# Patient Record
Sex: Male | Born: 1965 | Race: Black or African American | Hispanic: No | Marital: Single | Smoking: Former smoker
Health system: Southern US, Community
[De-identification: ages and names within clinical notes are randomized; demographics above are authoritative.]

## PROBLEM LIST (undated history)

## (undated) DIAGNOSIS — C3490 Malignant neoplasm of unspecified part of unspecified bronchus or lung: Secondary | ICD-10-CM

## (undated) HISTORY — PX: APPENDECTOMY: SHX54

---

## 2005-04-09 ENCOUNTER — Emergency Department (HOSPITAL_COMMUNITY): Admission: EM | Admit: 2005-04-09 | Discharge: 2005-04-09 | Payer: Self-pay | Admitting: Emergency Medicine

## 2011-11-10 ENCOUNTER — Emergency Department (HOSPITAL_COMMUNITY)
Admission: EM | Admit: 2011-11-10 | Discharge: 2011-11-10 | Disposition: A | Discharge status: Home / Self Care | Payer: Self-pay | Attending: Emergency Medicine | Admitting: Emergency Medicine

## 2011-11-10 ENCOUNTER — Emergency Department (HOSPITAL_COMMUNITY): Payer: Self-pay

## 2011-11-10 DIAGNOSIS — W19XXXA Unspecified fall, initial encounter: Secondary | ICD-10-CM | POA: Insufficient documentation

## 2011-11-10 DIAGNOSIS — S060X9A Concussion with loss of consciousness of unspecified duration, initial encounter: Secondary | ICD-10-CM | POA: Insufficient documentation

## 2011-11-10 DIAGNOSIS — R569 Unspecified convulsions: Secondary | ICD-10-CM | POA: Insufficient documentation

## 2011-11-10 DIAGNOSIS — F10929 Alcohol use, unspecified with intoxication, unspecified: Secondary | ICD-10-CM

## 2011-11-10 DIAGNOSIS — R51 Headache: Secondary | ICD-10-CM | POA: Insufficient documentation

## 2011-11-10 DIAGNOSIS — F1010 Alcohol abuse, uncomplicated: Secondary | ICD-10-CM | POA: Insufficient documentation

## 2011-11-10 DIAGNOSIS — S0990XA Unspecified injury of head, initial encounter: Secondary | ICD-10-CM

## 2011-11-10 LAB — CBC WITH DIFFERENTIAL/PLATELET
Eosinophils Relative: 1 % (ref 0–5)
HCT: 44.5 % (ref 39.0–52.0)
Lymphocytes Relative: 33 % (ref 12–46)
Lymphs Abs: 3.6 10*3/uL (ref 0.7–4.0)
MCV: 94.7 fL (ref 78.0–100.0)
Monocytes Absolute: 0.8 10*3/uL (ref 0.1–1.0)
RBC: 4.7 MIL/uL (ref 4.22–5.81)
WBC: 10.8 10*3/uL — ABNORMAL HIGH (ref 4.0–10.5)

## 2011-11-10 LAB — COMPREHENSIVE METABOLIC PANEL
ALT: 26 U/L (ref 0–53)
CO2: 22 mEq/L (ref 19–32)
Calcium: 9.4 mg/dL (ref 8.4–10.5)
Creatinine, Ser: 0.84 mg/dL (ref 0.50–1.35)
GFR calc Af Amer: >90 mL/min (ref >90)
GFR calc non Af Amer: >90 mL/min (ref >90)
Glucose, Bld: 96 mg/dL (ref 70–99)

## 2011-11-10 LAB — RAPID URINE DRUG SCREEN, HOSP PERFORMED
Barbiturates: NOT DETECTED
Cocaine: POSITIVE — AB
Opiates: NOT DETECTED

## 2011-11-10 LAB — URINALYSIS, ROUTINE W REFLEX MICROSCOPIC
Bilirubin Urine: NEGATIVE
Nitrite: NEGATIVE
Specific Gravity, Urine: <1.005 — ABNORMAL LOW (ref 1.005–1.030)
pH: 5.5 (ref 5.0–8.0)

## 2011-11-10 MED ORDER — SODIUM CHLORIDE 0.9 % IV BOLUS (SEPSIS)
1000.0000 mL | Freq: Once | INTRAVENOUS | Status: AC
  Administered 2011-11-10: 1000 mL via INTRAVENOUS
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
## 2011-11-10 NOTE — ED Notes (Signed)
Pt called friend to drive him home. Waiting for ride to arrive. Dr. Bernette Mayers arrive. nad noted. Pt alert and oriented. Sitting up in bed

## 2011-11-10 NOTE — ED Notes (Signed)
Pt resting in bed with nad noted. NSR on cardiac monitor. Alert at this time

## 2011-11-10 NOTE — ED Notes (Signed)
Sister here to transport pt home.
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
## 2013-03-26 ENCOUNTER — Emergency Department (HOSPITAL_COMMUNITY): Payer: Self-pay

## 2013-03-26 ENCOUNTER — Encounter (HOSPITAL_COMMUNITY): Payer: Self-pay | Admitting: Emergency Medicine

## 2013-03-26 ENCOUNTER — Emergency Department (HOSPITAL_COMMUNITY)
Admission: EM | Admit: 2013-03-26 | Discharge: 2013-03-27 | Disposition: A | Discharge status: Home / Self Care | Payer: Self-pay | Attending: Emergency Medicine | Admitting: Emergency Medicine

## 2013-03-26 DIAGNOSIS — S21212A Laceration without foreign body of left back wall of thorax without penetration into thoracic cavity, initial encounter: Secondary | ICD-10-CM

## 2013-03-26 DIAGNOSIS — F17200 Nicotine dependence, unspecified, uncomplicated: Secondary | ICD-10-CM | POA: Insufficient documentation

## 2013-03-26 DIAGNOSIS — Z23 Encounter for immunization: Secondary | ICD-10-CM | POA: Insufficient documentation

## 2013-03-26 DIAGNOSIS — S21209A Unspecified open wound of unspecified back wall of thorax without penetration into thoracic cavity, initial encounter: Secondary | ICD-10-CM | POA: Insufficient documentation

## 2013-03-26 DIAGNOSIS — S0003XA Contusion of scalp, initial encounter: Secondary | ICD-10-CM | POA: Insufficient documentation

## 2013-03-26 MED ORDER — TETANUS-DIPHTH-ACELL PERTUSSIS 5-2.5-18.5 LF-MCG/0.5 IM SUSP
0.5000 mL | Freq: Once | INTRAMUSCULAR | Status: AC
  Administered 2013-03-26: 0.5 mL via INTRAMUSCULAR
  Filled 2013-03-26: qty 0.5
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
## 2013-03-27 MED ORDER — IBUPROFEN 800 MG PO TABS: 800.0000 mg | ORAL_TABLET | Freq: Three times a day (TID) | ORAL | Status: DC

## 2013-03-27 MED ORDER — LIDOCAINE-EPINEPHRINE (PF) 1 %-1:200000 IJ SOLN
INTRAMUSCULAR | Status: AC
  Filled 2013-03-27: qty 10

## 2013-03-27 NOTE — ED Notes (Signed)
Discharge instructions given and reviewed with patient.  Prescription given for Ibuprofen; effects and use explained.  Patient verbalized understanding to take medication as directed and to keep wound clean and dry and return in 7 days for staple removal.  Patient ambulatory with steady gait; discharged home in good condition.

## 2013-03-30 NOTE — ED Provider Notes (Signed)
Medical screening examination/treatment/procedure(s) were performed by non-physician practitioner and as supervising physician I was immediately available for consultation/collaboration.  EKG Interpretation   None       Burk Hoctor, MD, FACEP   Brittony Billick L Jailani Hogans, MD 03/30/13 1559 

## 2013-04-05 ENCOUNTER — Encounter (HOSPITAL_COMMUNITY): Payer: Self-pay | Admitting: Emergency Medicine

## 2013-04-05 ENCOUNTER — Emergency Department (HOSPITAL_COMMUNITY)
Admission: EM | Admit: 2013-04-05 | Discharge: 2013-04-05 | Disposition: A | Discharge status: Home / Self Care | Payer: Self-pay | Attending: Emergency Medicine | Admitting: Emergency Medicine

## 2013-04-05 DIAGNOSIS — F17200 Nicotine dependence, unspecified, uncomplicated: Secondary | ICD-10-CM | POA: Insufficient documentation

## 2013-04-05 DIAGNOSIS — Z4802 Encounter for removal of sutures: Secondary | ICD-10-CM | POA: Insufficient documentation

## 2013-04-05 DIAGNOSIS — Z791 Long term (current) use of non-steroidal anti-inflammatories (NSAID): Secondary | ICD-10-CM | POA: Insufficient documentation
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
## 2013-04-05 NOTE — ED Notes (Signed)
Staples removed by Dr Read Drivers and dry dressing applied

## 2013-04-05 NOTE — ED Notes (Addendum)
Here for staple removal left back placed on 03/26/13.  Site clean and dry.    C/o pain in left arm

## 2014-06-14 ENCOUNTER — Encounter (HOSPITAL_COMMUNITY): Payer: Self-pay

## 2014-06-14 ENCOUNTER — Emergency Department (HOSPITAL_COMMUNITY): Payer: Self-pay

## 2014-06-14 ENCOUNTER — Emergency Department (HOSPITAL_COMMUNITY)
Admission: EM | Admit: 2014-06-14 | Discharge: 2014-06-14 | Disposition: A | Discharge status: Home / Self Care | Payer: Self-pay | Attending: Emergency Medicine | Admitting: Emergency Medicine

## 2014-06-14 DIAGNOSIS — M25511 Pain in right shoulder: Secondary | ICD-10-CM | POA: Insufficient documentation

## 2014-06-14 DIAGNOSIS — I10 Essential (primary) hypertension: Secondary | ICD-10-CM | POA: Insufficient documentation

## 2014-06-14 DIAGNOSIS — Z720 Tobacco use: Secondary | ICD-10-CM | POA: Insufficient documentation

## 2014-06-14 DIAGNOSIS — Z791 Long term (current) use of non-steroidal anti-inflammatories (NSAID): Secondary | ICD-10-CM | POA: Insufficient documentation

## 2014-06-14 MED ORDER — DEXAMETHASONE 4 MG PO TABS: 4.0000 mg | ORAL_TABLET | Freq: Two times a day (BID) | ORAL | Status: DC

## 2014-06-14 MED ORDER — HYDROCODONE-ACETAMINOPHEN 5-325 MG PO TABS
1.0000 | ORAL_TABLET | ORAL | Status: DC | PRN
Start: 2014-06-14 — End: 2014-08-11

## 2014-06-14 MED ORDER — IBUPROFEN 800 MG PO TABS
800.0000 mg | ORAL_TABLET | Freq: Once | ORAL | Status: AC
  Administered 2014-06-14: 800 mg via ORAL
  Filled 2014-06-14: qty 1

## 2014-06-14 MED ORDER — HYDROCHLOROTHIAZIDE 12.5 MG PO CAPS: 12.5000 mg | ORAL_CAPSULE | Freq: Every day | ORAL | Status: DC

## 2014-06-14 MED ORDER — HYDROCHLOROTHIAZIDE 12.5 MG PO CAPS
12.5000 mg | ORAL_CAPSULE | Freq: Every day | ORAL | Status: DC
  Administered 2014-06-14: 12.5 mg via ORAL
  Filled 2014-06-14 (×2): qty 1

## 2014-06-14 MED ORDER — IBUPROFEN 800 MG PO TABS: 800.0000 mg | ORAL_TABLET | Freq: Three times a day (TID) | ORAL | Status: DC

## 2014-06-14 NOTE — Discharge Instructions (Signed)
Shoulder Pain The shoulder is the joint that connects your arms to your body. The bones that form the shoulder joint include the upper arm bone (humerus), the shoulder blade (scapula), and the collarbone (clavicle). The top of the humerus is shaped like a ball and fits into a rather flat socket on the scapula (glenoid cavity). A combination of muscles and strong, fibrous tissues that connect muscles to bones (tendons) support your shoulder joint and hold the ball in the socket. Small, fluid-filled sacs (bursae) are located in different areas of the joint. They act as cushions between the bones and the overlying soft tissues and help reduce friction between the gliding tendons and the bone as you move your arm. Your shoulder joint allows a wide range of motion in your arm. This range of motion allows you to do things like scratch your back or throw a ball. However, this range of motion also makes your shoulder more prone to pain from overuse and injury. Causes of shoulder pain can originate from both injury and overuse and usually can be grouped in the following four categories:  Redness, swelling, and pain (inflammation) of the tendon (tendinitis) or the bursae (bursitis).  Instability, such as a dislocation of the joint.  Inflammation of the joint (arthritis).  Broken bone (fracture). HOME CARE INSTRUCTIONS   Apply ice to the sore area.  Put ice in a plastic bag.  Place a towel between your skin and the bag.  Leave the ice on for 15-20 minutes, 3-4 times per day for the first 2 days, or as directed by your health care provider.  Stop using cold packs if they do not help with the pain.  If you have a shoulder sling or immobilizer, wear it as long as your caregiver instructs. Only remove it to shower or bathe. Move your arm as little as possible, but keep your hand moving to prevent swelling.  Squeeze a soft ball or foam pad as much as possible to help prevent swelling.  Only take  over-the-counter or prescription medicines for pain, discomfort, or fever as directed by your caregiver. SEEK MEDICAL CARE IF:   Your shoulder pain increases, or new pain develops in your arm, hand, or fingers.  Your hand or fingers become cold and numb.  Your pain is not relieved with medicines. SEEK IMMEDIATE MEDICAL CARE IF:   Your arm, hand, or fingers are numb or tingling.  Your arm, hand, or fingers are significantly swollen or turn white or blue. MAKE SURE YOU:   Understand these instructions.  Will watch your condition.  Will get help right away if you are not doing well or get worse. Document Released: 01/10/2005 Document Revised: 08/17/2013 Document Reviewed: 03/17/2011 [facility] Patient Information 2015 [place], [place]. This information is not intended to replace advice given to you by your health care provider. Make sure you discuss any questions you have with your health care provider.  Hypertension Hypertension is another name for high blood pressure. High blood pressure forces your heart to work harder to pump blood. A blood pressure reading has two numbers, which includes a higher number over a lower number (example: 110/72). HOME CARE   Have your blood pressure rechecked by your doctor.  Only take medicine as told by your doctor. Follow the directions carefully. The medicine does not work as well if you skip doses. Skipping doses also puts you at risk for problems.  Do not smoke.  Monitor your blood pressure at home as told by your  doctor. GET HELP IF:  You think you are having a reaction to the medicine you are taking.  You have repeat headaches or feel dizzy.  You have puffiness (swelling) in your ankles.  You have trouble with your vision. GET HELP RIGHT AWAY IF:   You get a very bad headache and are confused.  You feel weak, numb, or faint.  You get chest or belly (abdominal) pain.  You throw up (vomit).  You cannot breathe very well. MAKE  SURE YOU:   Understand these instructions.  Will watch your condition.  Will get help right away if you are not doing well or get worse. Document Released: 09/19/2007 Document Revised: 04/07/2013 Document Reviewed: 01/23/2013 [facility] Patient Information 2015 [place], [place]. This information is not intended to replace advice given to you by your health care provider. Make sure you discuss any questions you have with your health care provider.

## 2014-06-14 NOTE — ED Provider Notes (Signed)
CSN: [id number]     Arrival date & time 06/14/14  0932 History   First MD Initiated Contact with Patient 06/14/14 [phone]     Chief Complaint  Patient presents with  . Shoulder Pain     (Consider location/radiation/quality/duration/timing/severity/associated sxs/prior Treatment) Patient is a 49 y.o. male presenting with shoulder pain. The history is provided by the patient.  Shoulder Pain Location:  Shoulder Time since incident:  2 weeks Injury: no   Shoulder location:  R shoulder Pain details:    Quality:  Aching and sharp   Radiates to:  R shoulder   Severity:  Moderate   Onset quality:  Gradual   Duration:  2 weeks   Timing:  Intermittent   Progression:  Worsening Chronicity:  Recurrent Handedness:  Right-handed Relieved by:  Nothing Worsened by:  Nothing tried Ineffective treatments:  None tried Associated symptoms: decreased range of motion and stiffness   Associated symptoms: no back pain and no neck pain   Risk factors: frequent fractures     History reviewed. No pertinent past medical history. History reviewed. No pertinent past surgical history. No family history on file. History  Substance Use Topics  . Smoking status: Current Every Day Smoker -- 0.50 packs/day    Types: Cigarettes  . Smokeless tobacco: Not on file  . Alcohol Use: 6.0 oz/week    10 Cans of beer per week     Comment: occ    Review of Systems  Constitutional: Negative for activity change.       All ROS Neg except as noted in HPI  HENT: Negative.   Eyes: Negative for photophobia and discharge.  Respiratory: Negative for cough, shortness of breath and wheezing.   Cardiovascular: Negative for chest pain and palpitations.  Gastrointestinal: Negative for abdominal pain and blood in stool.  Genitourinary: Negative for dysuria, frequency and hematuria.  Musculoskeletal: Positive for arthralgias and stiffness. Negative for back pain and neck pain.  Skin: Negative.   Neurological: Negative for  dizziness, seizures and speech difficulty.  Psychiatric/Behavioral: Negative for hallucinations and confusion.      Allergies  Review of patient's allergies indicates no known allergies.  Home Medications   Prior to Admission medications   Medication Sig Start Date End Date Taking? Authorizing Provider  ibuprofen (ADVIL,MOTRIN) 800 MG tablet Take 1 tablet (800 mg total) by mouth 3 (three) times daily. 03/27/13   Idalia Needle. Sanford, PA-C   BP 165/118 mmHg  Pulse 82  Temp(Src) 98.1 F (36.7 C) (Oral)  Resp 20  Ht 5\' 9"  (1.753 m)  Wt 150 lb (68.04 kg)  BMI 22.14 kg/m2  SpO2 99% Physical Exam  Constitutional: He is oriented to person, place, and time. He appears well-developed and well-nourished.  Non-toxic appearance.  HENT:  Head: Normocephalic.  Right Ear: Tympanic membrane and external ear normal.  Left Ear: Tympanic membrane and external ear normal.  Eyes: EOM and lids are normal. Pupils are equal, round, and reactive to light.  Neck: Normal range of motion. Neck supple. Carotid bruit is not present.  Cardiovascular: Normal rate, regular rhythm, normal heart sounds, intact distal pulses and normal pulses.   Pulmonary/Chest: Breath sounds normal. No respiratory distress.  Abdominal: Soft. Bowel sounds are normal. There is no tenderness. There is no guarding.  Musculoskeletal:       Right shoulder: He exhibits decreased range of motion and crepitus. He exhibits no swelling and no deformity.  No dislocation. No hot joints noted.  Lymphadenopathy:  Head (right side): No submandibular adenopathy present.       Head (left side): No submandibular adenopathy present.    He has no cervical adenopathy.  Neurological: He is alert and oriented to person, place, and time. He has normal strength. No cranial nerve deficit or sensory deficit.  Skin: Skin is warm and dry.  Psychiatric: He has a normal mood and affect. His speech is normal.  Nursing note and vitals reviewed.   ED  Course  Procedures (including critical care time) Labs Review Labs Reviewed - No data to display  Imaging Review Dg Shoulder Right  06/14/2014   CLINICAL DATA:  RIGHT shoulder pain for 2 weeks, worse with movement and at night, history smoking  EXAM: RIGHT SHOULDER - 2+ VIEW  COMPARISON:  RIGHT humeral radiographs 04/09/2005  FINDINGS: Osseous mineralization grossly normal for technique.  AC joint alignment normal.  No acute fracture, dislocation or bone destruction.  Visualized RIGHT ribs intact.  IMPRESSION: Normal exam.   Electronically Signed   By: Lavonia Dana M.D.   On: 06/14/2014 08:16     EKG Interpretation None      MDM  Xray of the right shoulder is negative fora fx or dislocation. Pt has hx of old trauma to the right shoulder.  NO hot joint or effusion. Pt to be treated with ibuprofen and decadron Pt's blood pressure is elevated. No hx of HBP per pt. Resources to local clinics given to the patient. Rx for HCTZ given until he can see the MD at the clinic.   Final diagnoses:  None    *I have reviewed nursing notes, vital signs, and all appropriate lab and imaging results for this patient.[address] PA-C 06/14/14 [id number]  Maudry Diego, MD 06/14/14 [phone]

## 2014-06-14 NOTE — ED Notes (Signed)
Pt c/o r shoulder pain x 2 weeks that is worse with movement and at night.  Denies injury.

## 2014-06-14 NOTE — ED Notes (Signed)
Pharmacy to bring micozide

## 2014-07-09 ENCOUNTER — Emergency Department (HOSPITAL_COMMUNITY)
Admission: EM | Admit: 2014-07-09 | Discharge: 2014-07-09 | Disposition: A | Discharge status: Home / Self Care | Payer: Self-pay | Attending: Emergency Medicine | Admitting: Emergency Medicine

## 2014-07-09 ENCOUNTER — Encounter (HOSPITAL_COMMUNITY): Payer: Self-pay | Admitting: *Deleted

## 2014-07-09 DIAGNOSIS — H538 Other visual disturbances: Secondary | ICD-10-CM | POA: Insufficient documentation

## 2014-07-09 DIAGNOSIS — Z79899 Other long term (current) drug therapy: Secondary | ICD-10-CM | POA: Insufficient documentation

## 2014-07-09 DIAGNOSIS — Z791 Long term (current) use of non-steroidal anti-inflammatories (NSAID): Secondary | ICD-10-CM | POA: Insufficient documentation

## 2014-07-09 DIAGNOSIS — Z720 Tobacco use: Secondary | ICD-10-CM | POA: Insufficient documentation

## 2014-07-09 MED ORDER — TETRACAINE HCL 0.5 % OP SOLN
2.0000 [drp] | Freq: Once | OPHTHALMIC | Status: AC
  Administered 2014-07-09: 2 [drp] via OPHTHALMIC
  Filled 2014-07-09: qty 2

## 2014-07-09 NOTE — ED Notes (Signed)
Patient with no complaints at this time. Respirations even and unlabored. Skin warm/dry. Discharge instructions reviewed with patient at this time. Patient given opportunity to voice concerns/ask questions. Patient discharged at this time and left Emergency Department with steady gait.   

## 2014-07-09 NOTE — ED Notes (Signed)
Dizzy and decreased vision on lt eye for 1 week.  No injury

## 2014-07-09 NOTE — ED Notes (Signed)
Patient c/o dizziness and left eye blurriness x1 week. Per patient that started shortly after being treated in an ER for right arm pain and weakness. Per patient was given 4 new medications but unsure of the name. Patient reports taking all of his medication.

## 2014-07-09 NOTE — Discharge Instructions (Signed)
Blurred Vision °You have been seen today complaining of blurred vision. This means you have a loss of ability to see small details.  °CAUSES  °Blurred vision can be a symptom of underlying eye problems, such as: °· Aging of the eye (presbyopia). °· Glaucoma. °· Cataracts. °· Eye infection. °· Eye-related migraine. °· Diabetes mellitus. °· Fatigue. °· Migraine headaches. °· High blood pressure. °· Breakdown of the back of the eye (macular degeneration). °· Problems caused by some medications. °The most common cause of blurred vision is the need for eyeglasses or a new prescription. Today in the emergency department, no cause for your blurred vision can be found. °SYMPTOMS  °Blurred vision is the loss of visual sharpness and detail (acuity). °DIAGNOSIS  °Should blurred vision continue, you should see your caregiver. If your caregiver is your primary care physician, he or she may choose to refer you to another specialist.  °TREATMENT  °Do not ignore your blurred vision. Make sure to have it checked out to see if further treatment or referral is necessary. °SEEK MEDICAL CARE IF:  °You are unable to get into a specialist so we can help you with a referral. °SEEK IMMEDIATE MEDICAL CARE IF: °You have severe eye pain, severe headache, or sudden loss of vision. °MAKE SURE YOU:  °· Understand these instructions. °· Will watch your condition. °· Will get help right away if you are not doing well or get worse. °Document Released: 04/05/2003 Document Revised: 06/25/2011 Document Reviewed: 11/05/2007 °[employer]® Patient Information ©2015 [employer]. This information is not intended to replace advice given to you by your health care provider. Make sure you discuss any questions you have with your health care provider. ° °

## 2014-07-09 NOTE — ED Provider Notes (Signed)
CSN: [id number]     Arrival date & time 07/09/14  8295 History  This chart was scribed for Virgel Manifold, MD by Delphia Grates, ED Scribe. This patient was seen in room APA01/APA01 and the patient's care was started at 7:22 PM.   Chief Complaint  Patient presents with  . Eye Problem    The history is provided by the patient. No language interpreter was used.     HPI Comments: Kilo A Champeau is a 49 y.o. male, with no significant past medical history, who presents to the Emergency Department complaining of gradual onset, constant blurred vision in the left eye for the past "few days". He denies prior injury or trauma. There is associated pain near the left brow. He denies history of prior similar episodes. No aggravating or alleviating factors noted. He denies headaches, tearing, or drainage. He further denies history of DM or any other chronic health conditions. Patient is not established with a PCP.   History reviewed. No pertinent past medical history. Past Surgical History  Procedure Laterality Date  . Appendectomy     History reviewed. No pertinent family history. History  Substance Use Topics  . Smoking status: Current Every Day Smoker -- 0.50 packs/day    Types: Cigarettes  . Smokeless tobacco: Not on file  . Alcohol Use: 6.0 oz/week    10 Cans of beer per week     Comment: occ    Review of Systems  Eyes: Positive for visual disturbance.  All other systems reviewed and are negative.     Allergies  Review of patient's allergies indicates no known allergies.  Home Medications   Prior to Admission medications   Medication Sig Start Date End Date Taking? Authorizing Provider  hydrochlorothiazide (MICROZIDE) 12.5 MG capsule Take 1 capsule (12.5 mg total) by mouth daily. 06/14/14  Yes Lily Kocher, PA-C  naproxen sodium (ANAPROX) 220 MG tablet Take 220 mg by mouth daily as needed (for pain).   Yes Historical Provider, MD  dexamethasone (DECADRON) 4 MG tablet Take 1  tablet (4 mg total) by mouth 2 (two) times daily with a meal. Patient not taking: Reported on 07/09/2014 06/14/14   Lily Kocher, PA-C  HYDROcodone-acetaminophen (NORCO/VICODIN) 5-325 MG per tablet Take 1 tablet by mouth every 4 (four) hours as needed. Patient not taking: Reported on 07/09/2014 06/14/14   Lily Kocher, PA-C  ibuprofen (ADVIL,MOTRIN) 800 MG tablet Take 1 tablet (800 mg total) by mouth 3 (three) times daily. Patient not taking: Reported on 07/09/2014 06/14/14   Lily Kocher, PA-C   Triage Vitals: BP 135/91 mmHg  Pulse 106  Temp(Src) 98.3 F (36.8 C) (Oral)  Resp 17  Ht 5\' 9"  (1.753 m)  Wt 155 lb (70.308 kg)  BMI 22.88 kg/m2  SpO2 96%  Physical Exam  Constitutional: He appears well-developed and well-nourished. No distress.  HENT:  Head: Normocephalic and atraumatic.  Eyes: Conjunctivae and EOM are normal. Pupils are equal, round, and reactive to light. Right eye exhibits no discharge. Left eye exhibits no discharge.  Pupils equal round and reactive to light and accommodation. Conjunctiva are clear. Optic disc on the left is sharp. Extraocular muscle functions are normal. IOP OS: 17,19. OD: 14  Neck: Neck supple.  Cardiovascular: Normal rate and regular rhythm.  Exam reveals no gallop and no friction rub.   No murmur heard. Pulmonary/Chest: Effort normal and breath sounds normal. No respiratory distress.  Abdominal: Soft. He exhibits no distension. There is no tenderness.  Musculoskeletal: He exhibits no edema  or tenderness.  Neurological: He is alert.  Skin: Skin is warm and dry.  Psychiatric: He has a normal mood and affect. His behavior is normal. Thought content normal.  Nursing note and vitals reviewed.   ED Course  Procedures (including critical care time)  DIAGNOSTIC STUDIES: Oxygen Saturation is 96% on room air, adequate by my interpretation.    COORDINATION OF CARE: At 1931 Discussed treatment plan with patient which includes visual acuity screening.  Patient agrees.   Labs Review Labs Reviewed - No data to display  Imaging Review No results found.   EKG Interpretation None      MDM   Final diagnoses:  Blurred vision, left eye    48yM with several weeks of blurred vision. My optho exam including IOP unremarkable. Outpt optho FU.  I personally preformed the services scribed in my presence. The recorded information has been reviewed is accurate. Virgel Manifold, MD.    Virgel Manifold, MD 07/14/14 [phone]

## 2014-08-06 ENCOUNTER — Encounter (HOSPITAL_COMMUNITY): Payer: Self-pay | Admitting: Emergency Medicine

## 2014-08-06 ENCOUNTER — Inpatient Hospital Stay (HOSPITAL_COMMUNITY)
Admission: EM | Admit: 2014-08-06 | Discharge: 2014-08-10 | DRG: 187 | Disposition: A | Discharge status: Home / Self Care | Payer: Medicaid Other | Attending: Internal Medicine | Admitting: Internal Medicine

## 2014-08-06 ENCOUNTER — Emergency Department (HOSPITAL_COMMUNITY): Payer: Medicaid Other

## 2014-08-06 DIAGNOSIS — C7951 Secondary malignant neoplasm of bone: Secondary | ICD-10-CM | POA: Diagnosis present

## 2014-08-06 DIAGNOSIS — K5900 Constipation, unspecified: Secondary | ICD-10-CM | POA: Diagnosis present

## 2014-08-06 DIAGNOSIS — Z833 Family history of diabetes mellitus: Secondary | ICD-10-CM | POA: Diagnosis not present

## 2014-08-06 DIAGNOSIS — I10 Essential (primary) hypertension: Secondary | ICD-10-CM | POA: Diagnosis present

## 2014-08-06 DIAGNOSIS — L309 Dermatitis, unspecified: Secondary | ICD-10-CM | POA: Diagnosis present

## 2014-08-06 DIAGNOSIS — J90 Pleural effusion, not elsewhere classified: Principal | ICD-10-CM | POA: Diagnosis present

## 2014-08-06 DIAGNOSIS — M25511 Pain in right shoulder: Secondary | ICD-10-CM | POA: Diagnosis present

## 2014-08-06 DIAGNOSIS — Z9889 Other specified postprocedural states: Secondary | ICD-10-CM

## 2014-08-06 DIAGNOSIS — J948 Other specified pleural conditions: Secondary | ICD-10-CM

## 2014-08-06 DIAGNOSIS — R918 Other nonspecific abnormal finding of lung field: Secondary | ICD-10-CM | POA: Diagnosis present

## 2014-08-06 DIAGNOSIS — R0600 Dyspnea, unspecified: Secondary | ICD-10-CM | POA: Diagnosis present

## 2014-08-06 DIAGNOSIS — Z87891 Personal history of nicotine dependence: Secondary | ICD-10-CM | POA: Diagnosis not present

## 2014-08-06 DIAGNOSIS — Z720 Tobacco use: Secondary | ICD-10-CM | POA: Diagnosis not present

## 2014-08-06 DIAGNOSIS — C801 Malignant (primary) neoplasm, unspecified: Secondary | ICD-10-CM | POA: Diagnosis present

## 2014-08-06 DIAGNOSIS — H539 Unspecified visual disturbance: Secondary | ICD-10-CM | POA: Diagnosis present

## 2014-08-06 LAB — CBG MONITORING, ED: Glucose-Capillary: 71 mg/dL (ref 70–99)

## 2014-08-06 LAB — CBC WITH DIFFERENTIAL/PLATELET
Basophils Absolute: 0 10*3/uL (ref 0.0–0.1)
Basophils Relative: 0 % (ref 0–1)
EOS ABS: 0.8 10*3/uL — AB (ref 0.0–0.7)
EOS PCT: 7 % — AB (ref 0–5)
HEMATOCRIT: 45.7 % (ref 39.0–52.0)
HEMOGLOBIN: 16 g/dL (ref 13.0–17.0)
Lymphocytes Relative: 22 % (ref 12–46)
Lymphs Abs: 2.6 10*3/uL (ref 0.7–4.0)
MCH: 32.5 pg (ref 26.0–34.0)
MCHC: 35 g/dL (ref 30.0–36.0)
MCV: 92.7 fL (ref 78.0–100.0)
MONOS PCT: 9 % (ref 3–12)
Monocytes Absolute: 1.1 10*3/uL — ABNORMAL HIGH (ref 0.1–1.0)
Neutro Abs: 7 10*3/uL (ref 1.7–7.7)
Neutrophils Relative %: 62 % (ref 43–77)
Platelets: 312 10*3/uL (ref 150–400)
RBC: 4.93 MIL/uL (ref 4.22–5.81)
RDW: 12.6 % (ref 11.5–15.5)
WBC: 11.6 10*3/uL — ABNORMAL HIGH (ref 4.0–10.5)

## 2014-08-06 LAB — COMPREHENSIVE METABOLIC PANEL
ALT: 9 U/L (ref 0–53)
AST: 16 U/L (ref 0–37)
Albumin: 3.8 g/dL (ref 3.5–5.2)
Alkaline Phosphatase: 84 U/L (ref 39–117)
Anion gap: 14 (ref 5–15)
BILIRUBIN TOTAL: 0.9 mg/dL (ref 0.3–1.2)
BUN: 16 mg/dL (ref 6–23)
CHLORIDE: 103 mmol/L (ref 96–112)
CO2: 22 mmol/L (ref 19–32)
CREATININE: 0.84 mg/dL (ref 0.50–1.35)
Calcium: 9.5 mg/dL (ref 8.4–10.5)
GLUCOSE: 89 mg/dL (ref 70–99)
Potassium: 3.7 mmol/L (ref 3.5–5.1)
Sodium: 139 mmol/L (ref 135–145)
Total Protein: 8.1 g/dL (ref 6.0–8.3)

## 2014-08-06 LAB — I-STAT CG4 LACTIC ACID, ED: Lactic Acid, Venous: 0.95 mmol/L (ref 0.5–2.0)

## 2014-08-06 LAB — BRAIN NATRIURETIC PEPTIDE: B NATRIURETIC PEPTIDE 5: 16 pg/mL (ref 0.0–100.0)

## 2014-08-06 MED ORDER — IOHEXOL 300 MG/ML  SOLN
80.0000 mL | Freq: Once | INTRAMUSCULAR | Status: AC | PRN
  Administered 2014-08-06: 80 mL via INTRAVENOUS

## 2014-08-06 MED ORDER — LEVOFLOXACIN IN D5W 500 MG/100ML IV SOLN
500.0000 mg | Freq: Once | INTRAVENOUS | Status: AC
  Administered 2014-08-06: 500 mg via INTRAVENOUS
  Filled 2014-08-06: qty 100

## 2014-08-06 MED ORDER — ONDANSETRON HCL 4 MG PO TABS: 4.0000 mg | ORAL_TABLET | Freq: Four times a day (QID) | ORAL | Status: DC | PRN

## 2014-08-06 MED ORDER — ONDANSETRON HCL 4 MG/2ML IJ SOLN: 4.0000 mg | Freq: Four times a day (QID) | INTRAMUSCULAR | Status: DC | PRN

## 2014-08-06 MED ORDER — HEPARIN SODIUM (PORCINE) 5000 UNIT/ML IJ SOLN
5000.0000 [IU] | Freq: Three times a day (TID) | INTRAMUSCULAR | Status: DC
  Administered 2014-08-06 – 2014-08-10 (×10): 5000 [IU] via SUBCUTANEOUS
  Filled 2014-08-06 (×8): qty 1

## 2014-08-06 MED ORDER — HYDROCODONE-ACETAMINOPHEN 5-325 MG PO TABS
1.0000 | ORAL_TABLET | Freq: Once | ORAL | Status: AC
  Administered 2014-08-06: 1 via ORAL
  Filled 2014-08-06: qty 1

## 2014-08-06 MED ORDER — SODIUM CHLORIDE 0.9 % IV BOLUS (SEPSIS)
1000.0000 mL | Freq: Once | INTRAVENOUS | Status: AC
  Administered 2014-08-06: 1000 mL via INTRAVENOUS

## 2014-08-06 MED ORDER — MORPHINE SULFATE 2 MG/ML IJ SOLN
2.0000 mg | INTRAMUSCULAR | Status: DC | PRN
  Administered 2014-08-06 – 2014-08-10 (×15): 2 mg via INTRAVENOUS
  Filled 2014-08-06 (×16): qty 1

## 2014-08-06 NOTE — ED Notes (Signed)
Pt reports he has had trouble with the vision in his L eye for approx 3 weeks. Seen here previously for same. Pt sent from Dr's office today for hypotension and tachycardia.

## 2014-08-06 NOTE — ED Provider Notes (Signed)
CSN: [id number]     Arrival date & time 08/06/14  1526 History   First MD Initiated Contact with Patient 08/06/14 1540     Chief Complaint  Patient presents with  . Tachycardia     (Consider location/radiation/quality/duration/timing/severity/associated sxs/prior Treatment) Patient is a 49 y.o. male presenting with weakness. The history is provided by the patient (the pt was sent over from eye md for tachycardia and hypotension).  Weakness This is a new problem. The current episode started more than 2 days ago. The problem occurs constantly. The problem has not changed since onset.Associated symptoms include shortness of breath. Pertinent negatives include no chest pain, no abdominal pain and no headaches. Nothing aggravates the symptoms. Nothing relieves the symptoms.    History reviewed. No pertinent past medical history. Past Surgical History  Procedure Laterality Date  . Appendectomy     Family History  Problem Relation Age of Onset  . Diabetes Mother    History  Substance Use Topics  . Smoking status: Current Every Day Smoker -- 0.50 packs/day    Types: Cigarettes  . Smokeless tobacco: Not on file  . Alcohol Use: No     Comment: occ    Review of Systems  Constitutional: Negative for appetite change and fatigue.  HENT: Negative for congestion, ear discharge and sinus pressure.   Eyes: Negative for discharge.  Respiratory: Positive for shortness of breath. Negative for cough.   Cardiovascular: Positive for palpitations. Negative for chest pain.  Gastrointestinal: Negative for abdominal pain and diarrhea.  Genitourinary: Negative for frequency and hematuria.  Musculoskeletal: Negative for back pain.  Skin: Negative for rash.  Neurological: Positive for weakness. Negative for seizures and headaches.  Psychiatric/Behavioral: Negative for hallucinations.      Allergies  Review of patient's allergies indicates no known allergies.  Home Medications   Prior to  Admission medications   Medication Sig Start Date End Date Taking? Authorizing Provider  Aspirin-Salicylamide-Caffeine 409-81-19 MG TABS Take 2 packets by mouth 3 (three) times daily as needed (FOR PAIN).   Yes Historical Provider, MD  naproxen sodium (ANAPROX) 220 MG tablet Take 220 mg by mouth daily as needed (for pain).   Yes Historical Provider, MD  dexamethasone (DECADRON) 4 MG tablet Take 1 tablet (4 mg total) by mouth 2 (two) times daily with a meal. Patient not taking: Reported on 07/09/2014 06/14/14   Lily Kocher, PA-C  hydrochlorothiazide (MICROZIDE) 12.5 MG capsule Take 1 capsule (12.5 mg total) by mouth daily. Patient not taking: Reported on 08/06/2014 06/14/14   Lily Kocher, PA-C  HYDROcodone-acetaminophen (NORCO/VICODIN) 5-325 MG per tablet Take 1 tablet by mouth every 4 (four) hours as needed. Patient not taking: Reported on 07/09/2014 06/14/14   Lily Kocher, PA-C  ibuprofen (ADVIL,MOTRIN) 800 MG tablet Take 1 tablet (800 mg total) by mouth 3 (three) times daily. Patient not taking: Reported on 07/09/2014 06/14/14   Lily Kocher, PA-C   BP 117/89 mmHg  Pulse 105  Temp(Src) 98.5 F (36.9 C) (Oral)  Resp 21  SpO2 97% Physical Exam  Constitutional: He is oriented to person, place, and time. He appears well-developed.  HENT:  Head: Normocephalic.  Eyes: Conjunctivae and EOM are normal. No scleral icterus.  Neck: Neck supple. No thyromegaly present.  Cardiovascular: Regular rhythm.  Exam reveals no gallop and no friction rub.   No murmur heard. Rapid rate  Pulmonary/Chest: No stridor. He has no wheezes. He has no rales. He exhibits no tenderness.  Decrease lung sounds on left  Abdominal: He  exhibits no distension. There is no tenderness. There is no rebound.  Musculoskeletal: Normal range of motion. He exhibits no edema.  Lymphadenopathy:    He has no cervical adenopathy.  Neurological: He is oriented to person, place, and time. He exhibits normal muscle tone. Coordination  normal.  Skin: No rash noted. No erythema.  Psychiatric: He has a normal mood and affect. His behavior is normal.    ED Course  Procedures (including critical care time) Labs Review Labs Reviewed  CBC WITH DIFFERENTIAL/PLATELET - Abnormal; Notable for the following:    WBC 11.6 (*)    Monocytes Absolute 1.1 (*)    Eosinophils Relative 7 (*)    Eosinophils Absolute 0.8 (*)    All other components within normal limits  CULTURE, BLOOD (ROUTINE X 2)  CULTURE, BLOOD (ROUTINE X 2)  COMPREHENSIVE METABOLIC PANEL  BRAIN NATRIURETIC PEPTIDE  URINALYSIS, ROUTINE W REFLEX MICROSCOPIC  CBG MONITORING, ED  I-STAT CG4 LACTIC ACID, ED    Imaging Review Dg Chest 2 View  08/06/2014   CLINICAL DATA:  Tachycardia. Blurred vision in the left on a for 3 weeks. Pain in weakness in the right upper extremity and right-sided chest today. History of smoking.  EXAM: CHEST  2 VIEW  COMPARISON:  None.  FINDINGS: The heart is enlarged. There is significant opacity in the left hemithorax which consists of pleural effusion and atelectasis or consolidation. The right lung is clear. No pulmonary edema.  IMPRESSION: 1. Cardiomegaly without edema. 2. Significant left lung opacity consistent with atelectasis or infiltrate and pleural effusion.   Electronically Signed   By: Nolon Nations M.D.   On: 08/06/2014 16:41   Ct Chest W Contrast  08/06/2014   CLINICAL DATA:  Hypotension and tachycardia. Left pleural effusion. Smoker  EXAM: CT CHEST WITH CONTRAST  TECHNIQUE: Multidetector CT imaging of the chest was performed during intravenous contrast administration.  CONTRAST:  87m OMNIPAQUE IOHEXOL 300 MG/ML  SOLN  COMPARISON:  Chest x-ray today  FINDINGS: Image quality degraded by motion.  Large left pleural effusion with compressive atelectasis of the left lower lobe. Left upper lobe is partially expanded. There is a probable mass lesion in the left hilum measuring 28 mm with probable adjacent left hilar adenopathy. Findings  are suspicious for carcinoma of the lung. 12 mm right paratracheal lymph node. 12 mm lymph node anterior to the left bronchus. No subcarinal enlarged lymph nodes.  Right lung is clear.  No lung nodule is identified.  Bony destructive lesions compatible with metastatic disease. There is a lesion on the right involving T3. Lesion in the left L1 vertebral body and pedicle extending into the lamina. Left-sided rib lesions.  Right adrenal enlargement measuring 26 mm. This has low density and most likely is adrenal adenoma rather than metastatic disease.  12 mm right thyroid lesion is indeterminate.  IMPRESSION: Large left pleural effusion with compressive atelectasis left lower lobe. There is a probable mass the left hilum compatible with carcinoma of the lung. PET CT suggested for staging. Bony metastatic disease is noted.  Right adrenal lesion most likely a benign adenoma rather than metastatic disease.  12 mm right thyroid lesion.  Thyroid ultrasound recommended.   Electronically Signed   By: CFranchot GalloM.D.   On: 08/06/2014 18:59     EKG Interpretation   Date/Time:  Friday August 06 2014 15:34:22 EDT Ventricular Rate:  121 PR Interval:  130 QRS Duration: 84 QT Interval:  298 QTC Calculation: 423 R Axis:  58 Text Interpretation:  Sinus tachycardia with Premature atrial complexes  Septal infarct , age undetermined Abnormal ECG Confirmed by Marleni Gallardo  MD,  Caralina Nop [phone]) on 08/06/2014 5:17:25 PM      MDM   Final diagnoses:  Lung mass        Milton Ferguson, MD 08/06/14 1925

## 2014-08-06 NOTE — H&P (Signed)
[facility] History and Physical  [name] JJO:[id number] DOB: [birth year] DOA: 08/06/2014  Referring physician: ER PCP: No PCP Per Patient   Chief Complaint: Dyspnea  HPI: [name] is a 49 y.o. male  This is a 49 year old man who gives a 2 week history of dyspnea on fairly minimal exertion. He says that he gets short of breath when he tries to put his clothes on and walking short distances. Even turning positions in the bed makes him somewhat short of breath. He denies any chest pain, palpitations, limb weakness. He has had a productive cough but white sputum. There is no fever. He is an ex smoker, having quit only 3 months ago. Prior to that he was smoking 1 pack of cigarettes per day since the age of 6. The patient himself denies any weight loss but the sister, who is at the bedside, thinks that he has been losing weight, although she cannot quantify this. The patient denies any hemoptysis or change in voice. The patient has been complaining of abnormal/blurred vision in the left eye. He was in the emergency room approximately one month ago and eye examination by the emergency room physician was unremarkable and he was recommended to follow with ophthalmology as an outpatient. He denies any headaches. He has had right shoulder pain but no back pain. 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 He is now being admitted for further management.   Review of Systems:  Apart from symptoms mentioned above, all other systems are negative.   History reviewed. No pertinent past medical history. Past Surgical History  Procedure Laterality Date  . Appendectomy     Social History:  reports that he has been smoking Cigarettes.  He has been smoking about 0.50 packs per day. He does not have any smokeless tobacco history on  file. He reports that he uses illicit drugs (Marijuana). He reports that he does not drink alcohol.  No Known Allergies  Family History  Problem Relation Age of Onset  . Diabetes Mother       Prior to Admission medications   Medication Sig Start Date End Date Taking? Authorizing Provider  Aspirin-Salicylamide-Caffeine 160-10-93 MG TABS Take 2 packets by mouth 3 (three) times daily as needed (FOR PAIN).   Yes Historical Provider, MD  naproxen sodium (ANAPROX) 220 MG tablet Take 220 mg by mouth daily as needed (for pain).   Yes Historical Provider, MD  dexamethasone (DECADRON) 4 MG tablet Take 1 tablet (4 mg total) by mouth 2 (two) times daily with a meal. Patient not taking: Reported on 07/09/2014 06/14/14   Lily Kocher, PA-C  hydrochlorothiazide (MICROZIDE) 12.5 MG capsule Take 1 capsule (12.5 mg total) by mouth daily. Patient not taking: Reported on 08/06/2014 06/14/14   Lily Kocher, PA-C  HYDROcodone-acetaminophen (NORCO/VICODIN) 5-325 MG per tablet Take 1 tablet by mouth every 4 (four) hours as needed. Patient not taking: Reported on 07/09/2014 06/14/14   Lily Kocher, PA-C  ibuprofen (ADVIL,MOTRIN) 800 MG tablet Take 1 tablet (800 mg total) by mouth 3 (three) times daily. Patient not taking: Reported on 07/09/2014 06/14/14   Lily Kocher, PA-C   Physical Exam: Filed Vitals:   08/06/14 1736 08/06/14 1900 08/06/14 1930 08/06/14 2000  BP:  117/89 129/106 120/89  Pulse:  105 113 101  Temp: 98.5 F (36.9 C)  TempSrc: Oral     Resp:  '21 24 23  '$ SpO2:  97% 95% 95%    Wt Readings from Last 3 Encounters:  07/09/14 70.308 kg (155 lb)  06/14/14 68.04 kg (150 lb)  04/05/13 79.833 kg (176 lb)    General:  Appears calm and comfortable. He does not look cachectic. He is nontoxic. He does not appear to have respiratory distress at rest. Eyes: PERRL, normal lids, irises & conjunctiva ENT: grossly normal hearing, lips & tongue Neck: no LAD, masses or thyromegaly Cardiovascular: RRR, no  m/r/g. No LE edema. Telemetry: SR, no arrhythmias  Respiratory: Reduced air entry on the left side. There is dullness to percussion in the left mid and lower zones. Breath sounds are not heard in the left mid and lower zones. There is no bronchial breathing, crackles or wheezing. The trachea appears to be central. Abdomen: soft, ntnd. There is no hepatosplenomegaly. Skin: no rash or induration seen on limited exam Musculoskeletal: grossly normal tone BUE/BLE Psychiatric: grossly normal mood and affect, speech fluent and appropriate Neurologic: grossly non-focal. Visual fields appear to be intact on my examination.           Labs on Admission:  Basic Metabolic Panel:  Recent Labs Lab 08/06/14 1609  NA 139  K 3.7  CL 103  CO2 22  GLUCOSE 89  BUN 16  CREATININE 0.84  CALCIUM 9.5   Liver Function Tests:  Recent Labs Lab 08/06/14 1609  AST 16  ALT 9  ALKPHOS 84  BILITOT 0.9  PROT 8.1  ALBUMIN 3.8   No results for input(s): LIPASE, AMYLASE in the last 168 hours. No results for input(s): AMMONIA in the last 168 hours. CBC:  Recent Labs Lab 08/06/14 1609  WBC 11.6*  NEUTROABS 7.0  HGB 16.0  HCT 45.7  MCV 92.7  PLT 312   Cardiac Enzymes: No results for input(s): CKTOTAL, CKMB, CKMBINDEX, TROPONINI in the last 168 hours.  BNP (last 3 results)  Recent Labs  08/06/14 1609  BNP 16.0    ProBNP (last 3 results) No results for input(s): PROBNP in the last 8760 hours.  CBG:  Recent Labs Lab 08/06/14 1549  GLUCAP 71    Radiological Exams on Admission: Dg Chest 2 View  08/06/2014   CLINICAL DATA:  Tachycardia. Blurred vision in the left on a for 3 weeks. Pain in weakness in the right upper extremity and right-sided chest today. History of smoking.  EXAM: CHEST  2 VIEW  COMPARISON:  None.  FINDINGS: The heart is enlarged. There is significant opacity in the left hemithorax which consists of pleural effusion and atelectasis or consolidation. The right lung is  clear. No pulmonary edema.  IMPRESSION: 1. Cardiomegaly without edema. 2. Significant left lung opacity consistent with atelectasis or infiltrate and pleural effusion.   Electronically Signed   By: Nolon Nations M.D.   On: 08/06/2014 16:41   Ct Chest W Contrast  08/06/2014   CLINICAL DATA:  Hypotension and tachycardia. Left pleural effusion. Smoker  EXAM: CT CHEST WITH CONTRAST  TECHNIQUE: Multidetector CT imaging of the chest was performed during intravenous contrast administration.  CONTRAST:  58m OMNIPAQUE IOHEXOL 300 MG/ML  SOLN  COMPARISON:  Chest x-ray today  FINDINGS: Image quality degraded by motion.  Large left pleural effusion with compressive atelectasis of the left lower lobe. Left upper lobe is partially expanded. There is a probable mass lesion in the left hilum measuring 28 mm with probable adjacent left hilar adenopathy. Findings are  suspicious for carcinoma of the lung. 12 mm right paratracheal lymph node. 12 mm lymph node anterior to the left bronchus. No subcarinal enlarged lymph nodes.  Right lung is clear.  No lung nodule is identified.  Bony destructive lesions compatible with metastatic disease. There is a lesion on the right involving T3. Lesion in the left L1 vertebral body and pedicle extending into the lamina. Left-sided rib lesions.  Right adrenal enlargement measuring 26 mm. This has low density and most likely is adrenal adenoma rather than metastatic disease.  12 mm right thyroid lesion is indeterminate.  IMPRESSION: Large left pleural effusion with compressive atelectasis left lower lobe. There is a probable mass the left hilum compatible with carcinoma of the lung. PET CT suggested for staging. Bony metastatic disease is noted.  Right adrenal lesion most likely a benign adenoma rather than metastatic disease.  12 mm right thyroid lesion.  Thyroid ultrasound recommended.   Electronically Signed   By: Franchot Gallo M.D.   On: 08/06/2014 18:59    EKG: Independently reviewed.  Normal sinus rhythm with sinus tachycardia. No acute ST-T wave changes.  Assessment/Plan   1. Left large pleural effusion with underlying mass. 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 CT scan also suggests metastatic bone lesions. We'll perform a diagnostic and therapeutic thoracentesis. We will ask pulmonology to see the patient in consultation. If the fluid does not show malignant cells, he will likely need more definitive tissue diagnosis. 2. Hypertension. Stable. 3. Visual deficit left eye. Etiology is not entirely clear and I'm not convinced that this is related to possible lung cancer. He will need ophthalmology consultation soon. His symptoms have been present for over one month.   Further recommendations will depend on patient's hospital progress.   Code Status: Full code.   DVT Prophylaxis: Heparin.  Family Communication: I discussed the plan with the patient at the bedside. I told the patient of the possible diagnosis of lung cancer and have also told him that we will need definitive tissue diagnosis first before this diagnosis can be made.  Disposition Plan: Home when medically stable.   Time spent: 60 minutes.  Doree Albee [facility] Pager [phone].

## 2014-08-07 DIAGNOSIS — Z720 Tobacco use: Secondary | ICD-10-CM

## 2014-08-07 LAB — COMPREHENSIVE METABOLIC PANEL
ALBUMIN: 3.7 g/dL (ref 3.5–5.2)
ALT: 9 U/L (ref 0–53)
AST: 14 U/L (ref 0–37)
Alkaline Phosphatase: 77 U/L (ref 39–117)
Anion gap: 12 (ref 5–15)
BUN: 17 mg/dL (ref 6–23)
CO2: 22 mmol/L (ref 19–32)
Calcium: 9.1 mg/dL (ref 8.4–10.5)
Chloride: 103 mmol/L (ref 96–112)
Creatinine, Ser: 0.83 mg/dL (ref 0.50–1.35)
GFR calc Af Amer: >90 mL/min (ref >90)
GFR calc non Af Amer: >90 mL/min (ref >90)
Glucose, Bld: 80 mg/dL (ref 70–99)
Potassium: 3.7 mmol/L (ref 3.5–5.1)
Sodium: 137 mmol/L (ref 135–145)
TOTAL PROTEIN: 7.8 g/dL (ref 6.0–8.3)
Total Bilirubin: 0.6 mg/dL (ref 0.3–1.2)

## 2014-08-07 MED ORDER — HYDROCODONE-ACETAMINOPHEN 5-325 MG PO TABS
1.0000 | ORAL_TABLET | ORAL | Status: DC | PRN
  Administered 2014-08-07 – 2014-08-09 (×9): 2 via ORAL
  Filled 2014-08-07 (×10): qty 2

## 2014-08-07 NOTE — Progress Notes (Signed)
[facility]                                                                              Patient Demographics  [name], is a 49 y.o. male, DOB - [birth year], DTO:[id number]  Admit date - 08/06/2014   Admitting Physician Doree Albee, MD  Outpatient Primary MD for the patient is No PCP Per Patient  LOS - 1   Chief Complaint  Patient presents with  . Tachycardia      HPI on 08/06/2014 by Dr. Hurshel Party This is a 49 year old man who gives a 2 week history of dyspnea on fairly minimal exertion. He says that he gets short of breath when he tries to put his clothes on and walking short distances. Even turning positions in the bed makes him somewhat short of breath. He denies any chest pain, palpitations, limb weakness. He has had a productive cough but white sputum. There is no fever. He is an ex smoker, having quit only 3 months ago. Prior to that he was smoking 1 pack of cigarettes per day since the age of 79. The patient himself denies any weight loss but the sister, who is at the bedside, thinks that he has been losing weight, although she cannot quantify this. The patient denies any hemoptysis or change in voice. The patient has been complaining of abnormal/blurred vision in the left eye. He was in the emergency room approximately one month ago and eye examination by the emergency room physician was unremarkable and he was recommended to follow with ophthalmology as an outpatient. He denies any headaches. He has had right shoulder pain but no back pain. 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 He is now being admitted for further management.  Assessment & Plan   Dyspnea secondary to pleural effusion and lung mass -CT chest: Large left pleural effusion with compressive atelectasis left lower lobe, probable  mass left hilum, bony metastatic disease, right adrenal lesion, 12 mm right thyroid lesion -Pulmonology consulted and appreciated -oncology consulted and appreciated- will wait for thoracentesis and pursue further workup as an outpatient -IR consulted and appreciated for US thoracentesis (will send for cytology) -Patient currently stable -Patient was given 1 dose of levaquin in the ED, however, does not appear to have pneumonia  Essential Hypertension -Currently stable, HCTZ held -Will started medications as needed  Visual deficit of left eye -Ongoing for over 1 month -Will need ophthalmology follow up as an outpatient  Tobacco abuse -Recently stopped smoking as he was "feeling bad"  Code Status: Full  Family Communication: None at bedside  Disposition Plan: Admitted, pending thoracentesis  Time Spent in minutes   30 minutes  Procedures  None  Consults   Interventional radiology Oncology Pulmonology  DVT Prophylaxis  Heparin  Lab Results  Component Value Date   PLT 312 08/06/2014    Medications  Scheduled Meds: . heparin  5,000 Units Subcutaneous 3 times per day   Continuous Infusions:  PRN Meds:.morphine injection, ondansetron **OR** ondansetron (ZOFRAN) IV  Antibiotics    Anti-infectives    Start     Dose/Rate Route Frequency Ordered Stop   08/06/14 1730  levofloxacin (LEVAQUIN) IVPB 500 mg     500 mg 100 mL/hr over 60 Minutes Intravenous  Once 08/06/14 1716 08/06/14 1931      Subjective:   [name] seen and examined today.  Patient denies chest pain at this time.  States shortness of breath is about the same but better as compared to when he first came.  Denies abdominal pain, N/V/D/C, dizziness, headache.  States his left eye has had some blurriness for more than one month.   Objective:   Filed Vitals:   08/06/14 2000 08/06/14 2120 08/07/14 0533 08/07/14 0620  BP: 120/89 127/97 130/100 126/85  Pulse: 101 100 91 87  Temp:  97.5 F (36.4 C)  98.2 F (36.8 C)   TempSrc:  Oral Oral   Resp: '23 22 21   '$ Height:  '5\' 6"'$  (1.676 m)    SpO2: 95% 99% 97% 96%    Wt Readings from Last 3 Encounters:  07/09/14 70.308 kg (155 lb)  06/14/14 68.04 kg (150 lb)  04/05/13 79.833 kg (176 lb)     Intake/Output Summary (Last 24 hours) at 08/07/14 1140 Last data filed at 08/07/14 0037  Gross per 24 hour  Intake      0 ml  Output    300 ml  Net   -300 ml    Exam  General: Well developed, well nourished, NAD, appears stated age  HEENT: NCAT, mucous membranes moist.   Cardiovascular: S1 S2 auscultated, no rubs, murmurs or gallops. Regular rate and rhythm.  Respiratory: Diminished breath sounds on left  Abdomen: Soft, nontender, nondistended, + bowel sounds  Extremities: warm dry without cyanosis clubbing or edema  Neuro: AAOx3, nonfocal   Psych: Normal affect and demeanor   Data Review   Micro Results Recent Results (from the past 240 hour(s))  Blood culture (routine x 2)     Status: None (Preliminary result)   Collection Time: 08/06/14  5:40 PM  Result Value Ref Range Status   Specimen Description BLOOD LEFT ARM  Final   Special Requests BOTTLES DRAWN AEROBIC AND ANAEROBIC 6CC  Final   Culture NO GROWTH 1 DAY  Final   Report Status PENDING  Incomplete  Blood culture (routine x 2)     Status: None (Preliminary result)   Collection Time: 08/06/14  5:45 PM  Result Value Ref Range Status   Specimen Description BLOOD LEFT HAND  Final   Special Requests BOTTLES DRAWN AEROBIC AND ANAEROBIC 6CC  Final   Culture NO GROWTH 1 DAY  Final   Report Status PENDING  Incomplete    Radiology Reports Dg Chest 2 View  08/06/2014   CLINICAL DATA:  Tachycardia. Blurred vision in the left on a for 3 weeks. Pain in weakness in the right upper extremity and right-sided chest today. History of smoking.  EXAM: CHEST  2 VIEW  COMPARISON:  None.  FINDINGS: The heart is enlarged. There is significant opacity in the left hemithorax which consists  of pleural effusion and atelectasis or consolidation. The right lung is clear. No pulmonary edema.  IMPRESSION: 1. Cardiomegaly without edema. 2. Significant left lung opacity consistent with atelectasis or infiltrate and pleural effusion.   Electronically Signed   By: Nolon Nations M.D.   On: 08/06/2014 16:41   Ct Chest W Contrast  08/06/2014   CLINICAL DATA:  Hypotension and tachycardia. Left pleural  effusion. Smoker  EXAM: CT CHEST WITH CONTRAST  TECHNIQUE: Multidetector CT imaging of the chest was performed during intravenous contrast administration.  CONTRAST:  28m OMNIPAQUE IOHEXOL 300 MG/ML  SOLN  COMPARISON:  Chest x-ray today  FINDINGS: Image quality degraded by motion.  Large left pleural effusion with compressive atelectasis of the left lower lobe. Left upper lobe is partially expanded. There is a probable mass lesion in the left hilum measuring 28 mm with probable adjacent left hilar adenopathy. Findings are suspicious for carcinoma of the lung. 12 mm right paratracheal lymph node. 12 mm lymph node anterior to the left bronchus. No subcarinal enlarged lymph nodes.  Right lung is clear.  No lung nodule is identified.  Bony destructive lesions compatible with metastatic disease. There is a lesion on the right involving T3. Lesion in the left L1 vertebral body and pedicle extending into the lamina. Left-sided rib lesions.  Right adrenal enlargement measuring 26 mm. This has low density and most likely is adrenal adenoma rather than metastatic disease.  12 mm right thyroid lesion is indeterminate.  IMPRESSION: Large left pleural effusion with compressive atelectasis left lower lobe. There is a probable mass the left hilum compatible with carcinoma of the lung. PET CT suggested for staging. Bony metastatic disease is noted.  Right adrenal lesion most likely a benign adenoma rather than metastatic disease.  12 mm right thyroid lesion.  Thyroid ultrasound recommended.   Electronically Signed   By:  CFranchot GalloM.D.   On: 08/06/2014 18:59    CBC  Recent Labs Lab 08/06/14 1609  WBC 11.6*  HGB 16.0  HCT 45.7  PLT 312  MCV 92.7  MCH 32.5  MCHC 35.0  RDW 12.6  LYMPHSABS 2.6  MONOABS 1.1*  EOSABS 0.8*  BASOSABS 0.0    Chemistries   Recent Labs Lab 08/06/14 1609 08/07/14 0501  NA 139 137  K 3.7 3.7  CL 103 103  CO2 22 22  GLUCOSE 89 80  BUN 16 17  CREATININE 0.84 0.83  CALCIUM 9.5 9.1  AST 16 14  ALT 9 9  ALKPHOS 84 77  BILITOT 0.9 0.6   ------------------------------------------------------------------------------------------------------------------ CrCl cannot be calculated (Unknown ideal weight.). ------------------------------------------------------------------------------------------------------------------ No results for input(s): HGBA1C in the last 72 hours. ------------------------------------------------------------------------------------------------------------------ No results for input(s): CHOL, HDL, LDLCALC, TRIG, CHOLHDL, LDLDIRECT in the last 72 hours. ------------------------------------------------------------------------------------------------------------------ No results for input(s): TSH, T4TOTAL, T3FREE, THYROIDAB in the last 72 hours.  Invalid input(s): FREET3 ------------------------------------------------------------------------------------------------------------------ No results for input(s): VITAMINB12, FOLATE, FERRITIN, TIBC, IRON, RETICCTPCT in the last 72 hours.  Coagulation profile No results for input(s): INR, PROTIME in the last 168 hours.  No results for input(s): DDIMER in the last 72 hours.  Cardiac Enzymes No results for input(s): CKMB, TROPONINI, MYOGLOBIN in the last 168 hours.  Invalid input(s): CK ------------------------------------------------------------------------------------------------------------------ Invalid input(s): POCBNP    Nasiah Polinsky D.O. on 08/07/2014 at 11:40 AM  Between 7am to  7pm - Pager - [phone] After 7pm go to [email or web address] - password [email or web address]  And look for the night coverage person covering for me after hours  [facility] Office  [phone]

## 2014-08-07 NOTE — Consult Note (Signed)
Consult requested by: [facility] Consult requested for abnormal chest CT:  HPI: This is a 49 year old who came to the emergency department because of shortness of breath. He has become increasingly short of breath for about 2 weeks. He stopped smoking about 3 months ago because he was feeling "bad". He has apparently lost some weight. He's not had any hemoptysis. He's had blurred vision. He's had right shoulder pain. Evaluation in the emergency room showed a large left pleural effusion with an underlying lung mass and what seems to be bony metastatic disease as well.  History reviewed. No pertinent past medical history.   Family History  Problem Relation Age of Onset  . Diabetes Mother      History   Social History  . Marital Status: Single    Spouse Name: N/A  . Number of Children: N/A  . Years of Education: N/A   Social History Main Topics  . Smoking status: Current Every Day Smoker -- 0.50 packs/day    Types: Cigarettes  . Smokeless tobacco: Not on file  . Alcohol Use: No     Comment: occ  . Drug Use: Yes    Special: Marijuana     Comment: 6 months ago  . Sexual Activity: Not on file   Other Topics Concern  . None   Social History Narrative     ROS: He says he has had right shoulder pain. He has pain in the left side of his chest. He denies any hemoptysis. The rest per the history and physical    Objective: Vital signs in last 24 hours: Temp:  [97.5 F (36.4 C)-98.5 F (36.9 C)] 98.2 F (36.8 C) (04/23 0533) Pulse Rate:  [87-135] 87 (04/23 0620) Resp:  [18-24] 21 (04/23 0533) BP: (115-136)/(85-110) 126/85 mmHg (04/23 0620) SpO2:  [94 %-99 %] 96 % (04/23 0620) Weight change:  Last BM Date: 08/06/14  Intake/Output from previous day: 04/22 0701 - 04/23 0700 In: -  Out: 300 [Urine:300]  PHYSICAL EXAM He is awake and alert. His neck is supple. His chest is relatively clear with markedly diminished breath sounds on the left. His heart is regular  without gallop. His abdomen soft. Extremities showed no edema. Central nervous system examination is grossly intact branch are moist  Lab Results: Basic Metabolic Panel:  Recent Labs  08/06/14 1609 08/07/14 0501  NA 139 137  K 3.7 3.7  CL 103 103  CO2 22 22  GLUCOSE 89 80  BUN 16 17  CREATININE 0.84 0.83  CALCIUM 9.5 9.1   Liver Function Tests:  Recent Labs  08/06/14 1609 08/07/14 0501  AST 16 14  ALT 9 9  ALKPHOS 84 77  BILITOT 0.9 0.6  PROT 8.1 7.8  ALBUMIN 3.8 3.7   No results for input(s): LIPASE, AMYLASE in the last 72 hours. No results for input(s): AMMONIA in the last 72 hours. CBC:  Recent Labs  08/06/14 1609  WBC 11.6*  NEUTROABS 7.0  HGB 16.0  HCT 45.7  MCV 92.7  PLT 312   Cardiac Enzymes: No results for input(s): CKTOTAL, CKMB, CKMBINDEX, TROPONINI in the last 72 hours. BNP: No results for input(s): PROBNP in the last 72 hours. D-Dimer: No results for input(s): DDIMER in the last 72 hours. CBG:  Recent Labs  08/06/14 1549  GLUCAP 71   Hemoglobin A1C: No results for input(s): HGBA1C in the last 72 hours. Fasting Lipid Panel: No results for input(s): CHOL, HDL, LDLCALC, TRIG, CHOLHDL, LDLDIRECT in the last 72  hours. Thyroid Function Tests: No results for input(s): TSH, T4TOTAL, FREET4, T3FREE, THYROIDAB in the last 72 hours. Anemia Panel: No results for input(s): VITAMINB12, FOLATE, FERRITIN, TIBC, IRON, RETICCTPCT in the last 72 hours. Coagulation: No results for input(s): LABPROT, INR in the last 72 hours. Urine Drug Screen: Drugs of Abuse     Component Value Date/Time   LABOPIA NONE DETECTED 11/10/2011 1459   COCAINSCRNUR POSITIVE* 11/10/2011 1459   LABBENZ NONE DETECTED 11/10/2011 1459   AMPHETMU NONE DETECTED 11/10/2011 1459   THCU NONE DETECTED 11/10/2011 1459   LABBARB NONE DETECTED 11/10/2011 1459    Alcohol Level: No results for input(s): ETH in the last 72 hours. Urinalysis: No results for input(s): COLORURINE,  LABSPEC, PHURINE, GLUCOSEU, HGBUR, BILIRUBINUR, KETONESUR, PROTEINUR, UROBILINOGEN, NITRITE, LEUKOCYTESUR in the last 72 hours.  Invalid input(s): APPERANCEUR Misc. Labs:   ABGS: No results for input(s): PHART, PO2ART, TCO2, HCO3 in the last 72 hours.  Invalid input(s): PCO2   MICROBIOLOGY: Recent Results (from the past 240 hour(s))  Blood culture (routine x 2)     Status: None (Preliminary result)   Collection Time: 08/06/14  5:40 PM  Result Value Ref Range Status   Specimen Description BLOOD LEFT ARM  Final   Special Requests BOTTLES DRAWN AEROBIC AND ANAEROBIC 6CC  Final   Culture NO GROWTH 1 DAY  Final   Report Status PENDING  Incomplete  Blood culture (routine x 2)     Status: None (Preliminary result)   Collection Time: 08/06/14  5:45 PM  Result Value Ref Range Status   Specimen Description BLOOD LEFT HAND  Final   Special Requests BOTTLES DRAWN AEROBIC AND ANAEROBIC 6CC  Final   Culture NO GROWTH 1 DAY  Final   Report Status PENDING  Incomplete    Studies/Results: Dg Chest 2 View  08/06/2014   CLINICAL DATA:  Tachycardia. Blurred vision in the left on a for 3 weeks. Pain in weakness in the right upper extremity and right-sided chest today. History of smoking.  EXAM: CHEST  2 VIEW  COMPARISON:  None.  FINDINGS: The heart is enlarged. There is significant opacity in the left hemithorax which consists of pleural effusion and atelectasis or consolidation. The right lung is clear. No pulmonary edema.  IMPRESSION: 1. Cardiomegaly without edema. 2. Significant left lung opacity consistent with atelectasis or infiltrate and pleural effusion.   Electronically Signed   By: Nolon Nations M.D.   On: 08/06/2014 16:41   Ct Chest W Contrast  08/06/2014   CLINICAL DATA:  Hypotension and tachycardia. Left pleural effusion. Smoker  EXAM: CT CHEST WITH CONTRAST  TECHNIQUE: Multidetector CT imaging of the chest was performed during intravenous contrast administration.  CONTRAST:  64m  OMNIPAQUE IOHEXOL 300 MG/ML  SOLN  COMPARISON:  Chest x-ray today  FINDINGS: Image quality degraded by motion.  Large left pleural effusion with compressive atelectasis of the left lower lobe. Left upper lobe is partially expanded. There is a probable mass lesion in the left hilum measuring 28 mm with probable adjacent left hilar adenopathy. Findings are suspicious for carcinoma of the lung. 12 mm right paratracheal lymph node. 12 mm lymph node anterior to the left bronchus. No subcarinal enlarged lymph nodes.  Right lung is clear.  No lung nodule is identified.  Bony destructive lesions compatible with metastatic disease. There is a lesion on the right involving T3. Lesion in the left L1 vertebral body and pedicle extending into the lamina. Left-sided rib lesions.  Right adrenal enlargement measuring  26 mm. This has low density and most likely is adrenal adenoma rather than metastatic disease.  12 mm right thyroid lesion is indeterminate.  IMPRESSION: Large left pleural effusion with compressive atelectasis left lower lobe. There is a probable mass the left hilum compatible with carcinoma of the lung. PET CT suggested for staging. Bony metastatic disease is noted.  Right adrenal lesion most likely a benign adenoma rather than metastatic disease.  12 mm right thyroid lesion.  Thyroid ultrasound recommended.   Electronically Signed   By: Franchot Gallo M.D.   On: 08/06/2014 18:59    Medications:  Prior to Admission:  Prescriptions prior to admission  Medication Sig Dispense Refill Last Dose  . Aspirin-Salicylamide-Caffeine 626-94-85 MG TABS Take 2 packets by mouth 3 (three) times daily as needed (FOR PAIN).   08/06/2014 at Unknown time  . naproxen sodium (ANAPROX) 220 MG tablet Take 220 mg by mouth daily as needed (for pain).   08/06/2014 at Unknown time  . dexamethasone (DECADRON) 4 MG tablet Take 1 tablet (4 mg total) by mouth 2 (two) times daily with a meal. (Patient not taking: Reported on 07/09/2014) 12  tablet 0   . hydrochlorothiazide (MICROZIDE) 12.5 MG capsule Take 1 capsule (12.5 mg total) by mouth daily. (Patient not taking: Reported on 08/06/2014) 30 capsule 0 07/09/2014 at Unknown time  . HYDROcodone-acetaminophen (NORCO/VICODIN) 5-325 MG per tablet Take 1 tablet by mouth every 4 (four) hours as needed. (Patient not taking: Reported on 07/09/2014) 15 tablet 0   . ibuprofen (ADVIL,MOTRIN) 800 MG tablet Take 1 tablet (800 mg total) by mouth 3 (three) times daily. (Patient not taking: Reported on 07/09/2014) 21 tablet 0    Scheduled: . heparin  5,000 Units Subcutaneous 3 times per day   Continuous:  IOE:[id number] injection, ondansetron **OR** ondansetron (ZOFRAN) IV  Assesment: He has a large left pleural effusion and a lung mass. I think it's reasonable to go ahead with thoracentesis. If we don't get a diagnosis from thoracentesis he may need a biopsy of one of the bony lesions. If he has not had imaging of his brain he needs to have that done at some point during his workup because of his change in vision Active Problems:   Lung mass   Pleural effusion, left   Tobacco abuse   Hypertension    Plan: Discussed with Dr.Mikhail. She plans discussion with interventional radiology which I think is appropriate    LOS: 1 day   Cannon Arreola L 08/07/2014, 10:47 AM

## 2014-08-07 NOTE — Progress Notes (Signed)
Utilization review Completed Arianah Torgeson RN BSN   

## 2014-08-08 LAB — URINALYSIS, ROUTINE W REFLEX MICROSCOPIC
Glucose, UA: NEGATIVE mg/dL
Hgb urine dipstick: NEGATIVE
Ketones, ur: 40 mg/dL — AB
LEUKOCYTES UA: NEGATIVE
NITRITE: NEGATIVE
PH: 6 (ref 5.0–8.0)
Specific Gravity, Urine: >1.03 — ABNORMAL HIGH (ref 1.005–1.030)
Urobilinogen, UA: 1 mg/dL (ref 0.0–1.0)

## 2014-08-08 LAB — URINE MICROSCOPIC-ADD ON

## 2014-08-08 LAB — CBC
HCT: 43.5 % (ref 39.0–52.0)
HEMOGLOBIN: 14.9 g/dL (ref 13.0–17.0)
MCH: 32.3 pg (ref 26.0–34.0)
MCHC: 34.3 g/dL (ref 30.0–36.0)
MCV: 94.2 fL (ref 78.0–100.0)
Platelets: 302 10*3/uL (ref 150–400)
RBC: 4.62 MIL/uL (ref 4.22–5.81)
RDW: 12.8 % (ref 11.5–15.5)
WBC: 10 10*3/uL (ref 4.0–10.5)

## 2014-08-08 MED ORDER — NITROGLYCERIN 0.4 MG SL SUBL: 0.4000 mg | SUBLINGUAL_TABLET | SUBLINGUAL | Status: DC | PRN

## 2014-08-08 NOTE — Progress Notes (Signed)
Notified Dr. Ree Kida she voiced to me that this pain is most likely coming from the pleural effusion/ ? Mass.  No new orders given at this time.  Leda Min, RN notified.

## 2014-08-08 NOTE — Progress Notes (Signed)
Subjective: He says he feels better. He has no new complaints.  Objective: Vital signs in last 24 hours: Temp:  [97.5 F (36.4 C)-98 F (36.7 C)] 97.5 F (36.4 C) (04/24 0618) Pulse Rate:  [80-98] 80 (04/24 0618) Resp:  [20] 20 (04/24 0618) BP: (119-135)/(86-105) 135/105 mmHg (04/24 0618) SpO2:  [95 %-96 %] 95 % (04/24 0618) Weight:  [78.654 kg (173 lb 6.4 oz)] 78.654 kg (173 lb 6.4 oz) (04/23 1100) Weight change:  Last BM Date: 08/06/14  Intake/Output from previous day: 04/23 0701 - 04/24 0700 In: 720 [P.O.:720] Out: 650 [Urine:650]  PHYSICAL EXAM General appearance: alert, cooperative and mild distress Resp: Diminished breath sounds on the left Cardio: regular rate and rhythm, S1, S2 normal, no murmur, click, rub or gallop GI: soft, non-tender; bowel sounds normal; no masses,  no organomegaly Extremities: extremities normal, atraumatic, no cyanosis or edema  Lab Results:  Results for orders placed or performed during the hospital encounter of 08/06/14 (from the past 48 hour(s))  CBG monitoring, ED     Status: None   Collection Time: 08/06/14  3:49 PM  Result Value Ref Range   Glucose-Capillary 71 70 - 99 mg/dL  CBC with Differential/Platelet     Status: Abnormal   Collection Time: 08/06/14  4:09 PM  Result Value Ref Range   WBC 11.6 (H) 4.0 - 10.5 K/uL   RBC 4.93 4.22 - 5.81 MIL/uL   Hemoglobin 16.0 13.0 - 17.0 g/dL   HCT 45.7 39.0 - 52.0 %   MCV 92.7 78.0 - 100.0 fL   MCH 32.5 26.0 - 34.0 pg   MCHC 35.0 30.0 - 36.0 g/dL   RDW 12.6 11.5 - 15.5 %   Platelets 312 150 - 400 K/uL   Neutrophils Relative % 62 43 - 77 %   Neutro Abs 7.0 1.7 - 7.7 K/uL   Lymphocytes Relative 22 12 - 46 %   Lymphs Abs 2.6 0.7 - 4.0 K/uL   Monocytes Relative 9 3 - 12 %   Monocytes Absolute 1.1 (H) 0.1 - 1.0 K/uL   Eosinophils Relative 7 (H) 0 - 5 %   Eosinophils Absolute 0.8 (H) 0.0 - 0.7 K/uL   Basophils Relative 0 0 - 1 %   Basophils Absolute 0.0 0.0 - 0.1 K/uL  Comprehensive  metabolic panel     Status: None   Collection Time: 08/06/14  4:09 PM  Result Value Ref Range   Sodium 139 135 - 145 mmol/L   Potassium 3.7 3.5 - 5.1 mmol/L   Chloride 103 96 - 112 mmol/L   CO2 22 19 - 32 mmol/L   Glucose, Bld 89 70 - 99 mg/dL   BUN 16 6 - 23 mg/dL   Creatinine, Ser 0.84 0.50 - 1.35 mg/dL   Calcium 9.5 8.4 - 10.5 mg/dL   Total Protein 8.1 6.0 - 8.3 g/dL   Albumin 3.8 3.5 - 5.2 g/dL   AST 16 0 - 37 U/L   ALT 9 0 - 53 U/L   Alkaline Phosphatase 84 39 - 117 U/L   Total Bilirubin 0.9 0.3 - 1.2 mg/dL   GFR calc non Af Amer >90 >90 mL/min   GFR calc Af Amer >90 >90 mL/min    Comment: (NOTE) The eGFR has been calculated using the CKD EPI equation. This calculation has not been validated in all clinical situations. eGFR's persistently <90 mL/min signify possible Chronic Kidney Disease.    Anion gap 14 5 - 15  Brain natriuretic peptide  Status: None   Collection Time: 08/06/14  4:09 PM  Result Value Ref Range   B Natriuretic Peptide 16.0 0.0 - 100.0 pg/mL  I-Stat CG4 Lactic Acid, ED     Status: None   Collection Time: 08/06/14  5:35 PM  Result Value Ref Range   Lactic Acid, Venous 0.95 0.5 - 2.0 mmol/L  Blood culture (routine x 2)     Status: None (Preliminary result)   Collection Time: 08/06/14  5:40 PM  Result Value Ref Range   Specimen Description BLOOD LEFT ARM    Special Requests BOTTLES DRAWN AEROBIC AND ANAEROBIC 6CC    Culture NO GROWTH 1 DAY    Report Status PENDING   Blood culture (routine x 2)     Status: None (Preliminary result)   Collection Time: 08/06/14  5:45 PM  Result Value Ref Range   Specimen Description BLOOD LEFT HAND    Special Requests BOTTLES DRAWN AEROBIC AND ANAEROBIC 6CC    Culture NO GROWTH 1 DAY    Report Status PENDING   Comprehensive metabolic panel     Status: None   Collection Time: 08/07/14  5:01 AM  Result Value Ref Range   Sodium 137 135 - 145 mmol/L   Potassium 3.7 3.5 - 5.1 mmol/L   Chloride 103 96 - 112 mmol/L    CO2 22 19 - 32 mmol/L   Glucose, Bld 80 70 - 99 mg/dL   BUN 17 6 - 23 mg/dL   Creatinine, Ser 3.49 0.50 - 1.35 mg/dL   Calcium 9.1 8.4 - 64.5 mg/dL   Total Protein 7.8 6.0 - 8.3 g/dL   Albumin 3.7 3.5 - 5.2 g/dL   AST 14 0 - 37 U/L   ALT 9 0 - 53 U/L   Alkaline Phosphatase 77 39 - 117 U/L   Total Bilirubin 0.6 0.3 - 1.2 mg/dL   GFR calc non Af Amer >90 >90 mL/min   GFR calc Af Amer >90 >90 mL/min    Comment: (NOTE) The eGFR has been calculated using the CKD EPI equation. This calculation has not been validated in all clinical situations. eGFR's persistently <90 mL/min signify possible Chronic Kidney Disease.    Anion gap 12 5 - 15  CBC     Status: None   Collection Time: 08/08/14  5:48 AM  Result Value Ref Range   WBC 10.0 4.0 - 10.5 K/uL   RBC 4.62 4.22 - 5.81 MIL/uL   Hemoglobin 14.9 13.0 - 17.0 g/dL   HCT 78.7 77.0 - 42.9 %   MCV 94.2 78.0 - 100.0 fL   MCH 32.3 26.0 - 34.0 pg   MCHC 34.3 30.0 - 36.0 g/dL   RDW 76.7 67.0 - 01.1 %   Platelets 302 150 - 400 K/uL    ABGS No results for input(s): PHART, PO2ART, TCO2, HCO3 in the last 72 hours.  Invalid input(s): PCO2 CULTURES Recent Results (from the past 240 hour(s))  Blood culture (routine x 2)     Status: None (Preliminary result)   Collection Time: 08/06/14  5:40 PM  Result Value Ref Range Status   Specimen Description BLOOD LEFT ARM  Final   Special Requests BOTTLES DRAWN AEROBIC AND ANAEROBIC 6CC  Final   Culture NO GROWTH 1 DAY  Final   Report Status PENDING  Incomplete  Blood culture (routine x 2)     Status: None (Preliminary result)   Collection Time: 08/06/14  5:45 PM  Result Value Ref Range Status  Specimen Description BLOOD LEFT HAND  Final   Special Requests BOTTLES DRAWN AEROBIC AND ANAEROBIC 6CC  Final   Culture NO GROWTH 1 DAY  Final   Report Status PENDING  Incomplete   Studies/Results: Dg Chest 2 View  08/06/2014   CLINICAL DATA:  Tachycardia. Blurred vision in the left on a for 3 weeks. Pain  in weakness in the right upper extremity and right-sided chest today. History of smoking.  EXAM: CHEST  2 VIEW  COMPARISON:  None.  FINDINGS: The heart is enlarged. There is significant opacity in the left hemithorax which consists of pleural effusion and atelectasis or consolidation. The right lung is clear. No pulmonary edema.  IMPRESSION: 1. Cardiomegaly without edema. 2. Significant left lung opacity consistent with atelectasis or infiltrate and pleural effusion.   Electronically Signed   By: Nolon Nations M.D.   On: 08/06/2014 16:41   Ct Chest W Contrast  08/06/2014   CLINICAL DATA:  Hypotension and tachycardia. Left pleural effusion. Smoker  EXAM: CT CHEST WITH CONTRAST  TECHNIQUE: Multidetector CT imaging of the chest was performed during intravenous contrast administration.  CONTRAST:  26mL OMNIPAQUE IOHEXOL 300 MG/ML  SOLN  COMPARISON:  Chest x-ray today  FINDINGS: Image quality degraded by motion.  Large left pleural effusion with compressive atelectasis of the left lower lobe. Left upper lobe is partially expanded. There is a probable mass lesion in the left hilum measuring 28 mm with probable adjacent left hilar adenopathy. Findings are suspicious for carcinoma of the lung. 12 mm right paratracheal lymph node. 12 mm lymph node anterior to the left bronchus. No subcarinal enlarged lymph nodes.  Right lung is clear.  No lung nodule is identified.  Bony destructive lesions compatible with metastatic disease. There is a lesion on the right involving T3. Lesion in the left L1 vertebral body and pedicle extending into the lamina. Left-sided rib lesions.  Right adrenal enlargement measuring 26 mm. This has low density and most likely is adrenal adenoma rather than metastatic disease.  12 mm right thyroid lesion is indeterminate.  IMPRESSION: Large left pleural effusion with compressive atelectasis left lower lobe. There is a probable mass the left hilum compatible with carcinoma of the lung. PET CT  suggested for staging. Bony metastatic disease is noted.  Right adrenal lesion most likely a benign adenoma rather than metastatic disease.  12 mm right thyroid lesion.  Thyroid ultrasound recommended.   Electronically Signed   By: Franchot Gallo M.D.   On: 08/06/2014 18:59    Medications:  Prior to Admission:  Prescriptions prior to admission  Medication Sig Dispense Refill Last Dose  . Aspirin-Salicylamide-Caffeine 338-32-91 MG TABS Take 2 packets by mouth 3 (three) times daily as needed (FOR PAIN).   08/06/2014 at Unknown time  . naproxen sodium (ANAPROX) 220 MG tablet Take 220 mg by mouth daily as needed (for pain).   08/06/2014 at Unknown time  . dexamethasone (DECADRON) 4 MG tablet Take 1 tablet (4 mg total) by mouth 2 (two) times daily with a meal. (Patient not taking: Reported on 07/09/2014) 12 tablet 0   . hydrochlorothiazide (MICROZIDE) 12.5 MG capsule Take 1 capsule (12.5 mg total) by mouth daily. (Patient not taking: Reported on 08/06/2014) 30 capsule 0 07/09/2014 at Unknown time  . HYDROcodone-acetaminophen (NORCO/VICODIN) 5-325 MG per tablet Take 1 tablet by mouth every 4 (four) hours as needed. (Patient not taking: Reported on 07/09/2014) 15 tablet 0   . ibuprofen (ADVIL,MOTRIN) 800 MG tablet Take 1 tablet (800  mg total) by mouth 3 (three) times daily. (Patient not taking: Reported on 07/09/2014) 21 tablet 0    Scheduled: . heparin  5,000 Units Subcutaneous 3 times per day   Continuous:  [id number], morphine injection, ondansetron **OR** ondansetron (ZOFRAN) IV  Assesment: He has a lung mass and a pleural effusion. He is going to need thoracentesis which could not be arranged yesterday. Active Problems:   Lung mass   Pleural effusion, left   Tobacco abuse   Hypertension    Plan: Thoracentesis here tomorrow    LOS: 2 days   Yer Olivencia L 08/08/2014, 9:58 AM

## 2014-08-08 NOTE — Progress Notes (Signed)
[facility]                                                                              Patient Demographics  Audi Wettstein, is a 49 y.o. male, DOB - [birth year], SAY:[id number]  Admit date - 08/06/2014   Admitting Physician Doree Albee, MD  Outpatient Primary MD for the patient is No PCP Per Patient  LOS - 2   Chief Complaint  Patient presents with  . Tachycardia      HPI on 08/06/2014 by Dr. Hurshel Party This is a 49 year old man who gives a 2 week history of dyspnea on fairly minimal exertion. He says that he gets short of breath when he tries to put his clothes on and walking short distances. Even turning positions in the bed makes him somewhat short of breath. He denies any chest pain, palpitations, limb weakness. He has had a productive cough but white sputum. There is no fever. He is an ex smoker, having quit only 3 months ago. Prior to that he was smoking 1 pack of cigarettes per day since the age of 13. The patient himself denies any weight loss but the sister, who is at the bedside, thinks that he has been losing weight, although she cannot quantify this. The patient denies any hemoptysis or change in voice. The patient has been complaining of abnormal/blurred vision in the left eye. He was in the emergency room approximately one month ago and eye examination by the emergency room physician was unremarkable and he was recommended to follow with ophthalmology as an outpatient. He denies any headaches. He has had right shoulder pain but no back pain. 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 He is now being admitted for further management.  Assessment & Plan   Dyspnea secondary to pleural effusion and lung mass -CT chest: Large left pleural effusion with compressive atelectasis left lower lobe, probable  mass left hilum, bony metastatic disease, right adrenal lesion, 12 mm right thyroid lesion -Pulmonology consulted and appreciated -oncology consulted and appreciated- will wait for thoracentesis and pursue further workup as an outpatient -IR consulted and appreciated for US thoracentesis (will send for cytology)- tentative 08/09/2014 -Patient currently stable -Patient was given 1 dose of levaquin in the ED, however, does not appear to have pneumoia (currently afebrile, no leukocytosis) -Continue pain control   Essential Hypertension -Currently stable, HCTZ held -Will started medications as needed  Visual deficit of left eye -Ongoing for over 1 month -Will need ophthalmology follow up as an outpatient  Tobacco abuse -Recently stopped smoking as he was "feeling bad"  Code Status: Full  Family Communication: None at bedside  Disposition Plan: Admitted, pending thoracentesis, 4/25/20016  Time Spent in minutes   25 minutes  Procedures  None  Consults   Interventional radiology Oncology Pulmonology  DVT Prophylaxis  Heparin  Lab Results  Component Value Date   PLT 302 08/08/2014    Medications  Scheduled Meds: . heparin  5,000 Units Subcutaneous 3 times per day   Continuous  Infusions:  PRN Meds:.HYDROcodone-acetaminophen, morphine injection, ondansetron **OR** ondansetron (ZOFRAN) IV  Antibiotics    Anti-infectives    Start     Dose/Rate Route Frequency Ordered Stop   08/06/14 1730  levofloxacin (LEVAQUIN) IVPB 500 mg     500 mg 100 mL/hr over 60 Minutes Intravenous  Once 08/06/14 1716 08/06/14 1931      Subjective:   Adaiah Pick seen and examined today.  Patient feels his breathing is the same.  Denies chest pain at this time.  States he had some pain yesterday, but feels it is controlled today.  Denies abdominal pain, N/V/C/D.  States his vision is still different in the left eye. Denies headache or dizziness.   Objective:   Filed Vitals:   08/07/14 1100  08/07/14 1515 08/07/14 2242 08/08/14 0618  BP:  129/93 119/86 135/105  Pulse:  98 89 80  Temp:  98 F (36.7 C) 97.9 F (36.6 C) 97.5 F (36.4 C)  TempSrc:   Oral Oral  Resp:  '20 20 20  '$ Height: '5\' 8"'$  (1.727 m)     Weight: 78.654 kg (173 lb 6.4 oz)     SpO2:  95% 96% 95%    Wt Readings from Last 3 Encounters:  08/07/14 78.654 kg (173 lb 6.4 oz)  07/09/14 70.308 kg (155 lb)  06/14/14 68.04 kg (150 lb)     Intake/Output Summary (Last 24 hours) at 08/08/14 1201 Last data filed at 08/07/14 1700  Gross per 24 hour  Intake    240 ml  Output    650 ml  Net   -410 ml    Exam  General: Well developed, well nourished, no distress  HEENT: NCAT, mucous membranes moist.   Cardiovascular: normal S1/S2, RRR, no murmurs  Respiratory: Diminished breath sounds on left, otherwise clear  Abdomen: Soft, nontender, nondistended, + bowel sounds  Extremities: warm dry without cyanosis clubbing or edema  Neuro: AAOx3, nonfocal   Psych: Appropriate   Data Review   Micro Results Recent Results (from the past 240 hour(s))  Blood culture (routine x 2)     Status: None (Preliminary result)   Collection Time: 08/06/14  5:40 PM  Result Value Ref Range Status   Specimen Description BLOOD LEFT ARM  Final   Special Requests BOTTLES DRAWN AEROBIC AND ANAEROBIC 6CC  Final   Culture NO GROWTH 1 DAY  Final   Report Status PENDING  Incomplete  Blood culture (routine x 2)     Status: None (Preliminary result)   Collection Time: 08/06/14  5:45 PM  Result Value Ref Range Status   Specimen Description BLOOD LEFT HAND  Final   Special Requests BOTTLES DRAWN AEROBIC AND ANAEROBIC 6CC  Final   Culture NO GROWTH 1 DAY  Final   Report Status PENDING  Incomplete    Radiology Reports Dg Chest 2 View  08/06/2014   CLINICAL DATA:  Tachycardia. Blurred vision in the left on a for 3 weeks. Pain in weakness in the right upper extremity and right-sided chest today. History of smoking.  EXAM: CHEST  2 VIEW   COMPARISON:  None.  FINDINGS: The heart is enlarged. There is significant opacity in the left hemithorax which consists of pleural effusion and atelectasis or consolidation. The right lung is clear. No pulmonary edema.  IMPRESSION: 1. Cardiomegaly without edema. 2. Significant left lung opacity consistent with atelectasis or infiltrate and pleural effusion.   Electronically Signed   By: Nolon Nations M.D.   On: 08/06/2014 16:41  Ct Chest W Contrast  08/06/2014   CLINICAL DATA:  Hypotension and tachycardia. Left pleural effusion. Smoker  EXAM: CT CHEST WITH CONTRAST  TECHNIQUE: Multidetector CT imaging of the chest was performed during intravenous contrast administration.  CONTRAST:  64m OMNIPAQUE IOHEXOL 300 MG/ML  SOLN  COMPARISON:  Chest x-ray today  FINDINGS: Image quality degraded by motion.  Large left pleural effusion with compressive atelectasis of the left lower lobe. Left upper lobe is partially expanded. There is a probable mass lesion in the left hilum measuring 28 mm with probable adjacent left hilar adenopathy. Findings are suspicious for carcinoma of the lung. 12 mm right paratracheal lymph node. 12 mm lymph node anterior to the left bronchus. No subcarinal enlarged lymph nodes.  Right lung is clear.  No lung nodule is identified.  Bony destructive lesions compatible with metastatic disease. There is a lesion on the right involving T3. Lesion in the left L1 vertebral body and pedicle extending into the lamina. Left-sided rib lesions.  Right adrenal enlargement measuring 26 mm. This has low density and most likely is adrenal adenoma rather than metastatic disease.  12 mm right thyroid lesion is indeterminate.  IMPRESSION: Large left pleural effusion with compressive atelectasis left lower lobe. There is a probable mass the left hilum compatible with carcinoma of the lung. PET CT suggested for staging. Bony metastatic disease is noted.  Right adrenal lesion most likely a benign adenoma rather  than metastatic disease.  12 mm right thyroid lesion.  Thyroid ultrasound recommended.   Electronically Signed   By: CFranchot GalloM.D.   On: 08/06/2014 18:59    CBC  Recent Labs Lab 08/06/14 1609 08/08/14 0548  WBC 11.6* 10.0  HGB 16.0 14.9  HCT 45.7 43.5  PLT 312 302  MCV 92.7 94.2  MCH 32.5 32.3  MCHC 35.0 34.3  RDW 12.6 12.8  LYMPHSABS 2.6  --   MONOABS 1.1*  --   EOSABS 0.8*  --   BASOSABS 0.0  --     Chemistries   Recent Labs Lab 08/06/14 1609 08/07/14 0501  NA 139 137  K 3.7 3.7  CL 103 103  CO2 22 22  GLUCOSE 89 80  BUN 16 17  CREATININE 0.84 0.83  CALCIUM 9.5 9.1  AST 16 14  ALT 9 9  ALKPHOS 84 77  BILITOT 0.9 0.6   ------------------------------------------------------------------------------------------------------------------ estimated creatinine clearance is 103 mL/min (by C-G formula based on Cr of 0.83). ------------------------------------------------------------------------------------------------------------------ No results for input(s): HGBA1C in the last 72 hours. ------------------------------------------------------------------------------------------------------------------ No results for input(s): CHOL, HDL, LDLCALC, TRIG, CHOLHDL, LDLDIRECT in the last 72 hours. ------------------------------------------------------------------------------------------------------------------ No results for input(s): TSH, T4TOTAL, T3FREE, THYROIDAB in the last 72 hours.  Invalid input(s): FREET3 ------------------------------------------------------------------------------------------------------------------ No results for input(s): VITAMINB12, FOLATE, FERRITIN, TIBC, IRON, RETICCTPCT in the last 72 hours.  Coagulation profile No results for input(s): INR, PROTIME in the last 168 hours.  No results for input(s): DDIMER in the last 72 hours.  Cardiac Enzymes No results for input(s): CKMB, TROPONINI, MYOGLOBIN in the last 168 hours.  Invalid  input(s): CK ------------------------------------------------------------------------------------------------------------------ Invalid input(s): POCBNP    Chrystine Frogge D.O. on 08/08/2014 at 12:01 PM  Between 7am to 7pm - Pager - [phone] After 7pm go to [email or web address] - password [email or web address]  And look for the night coverage person covering for me after hours  [facility] Office  [phone]

## 2014-08-08 NOTE — Progress Notes (Signed)
Notified to the patients room due a complaint of the patient having chest pain.  He states the pain is in his right upper chest that is coming and going and feels like the pain is pulling.  Emergency orders initiated.  BP 128/96 HR 91 R 18 and the O2 sats on 2 liters via n/c is 100%.  Dr. Ree Kida  To be notified.

## 2014-08-09 ENCOUNTER — Inpatient Hospital Stay (HOSPITAL_COMMUNITY): Payer: Medicaid Other

## 2014-08-09 ENCOUNTER — Encounter (HOSPITAL_COMMUNITY): Payer: Self-pay

## 2014-08-09 DIAGNOSIS — J90 Pleural effusion, not elsewhere classified: Principal | ICD-10-CM

## 2014-08-09 DIAGNOSIS — R918 Other nonspecific abnormal finding of lung field: Secondary | ICD-10-CM

## 2014-08-09 LAB — BODY FLUID CELL COUNT WITH DIFFERENTIAL
EOS FL: 2 %
Lymphs, Fluid: 78 %
Monocyte-Macrophage-Serous Fluid: 4 % — ABNORMAL LOW (ref 50–90)
NEUTROPHIL FLUID: 16 % (ref 0–25)
Total Nucleated Cell Count, Fluid: 2360 cu mm — ABNORMAL HIGH (ref 0–1000)

## 2014-08-09 LAB — LACTATE DEHYDROGENASE, PLEURAL OR PERITONEAL FLUID: LD FL: 406 U/L — AB (ref 3–23)

## 2014-08-09 LAB — PROTEIN, BODY FLUID: Total protein, fluid: 5.6 g/dL

## 2014-08-09 LAB — GLUCOSE, SEROUS FLUID: GLUCOSE FL: 65 mg/dL

## 2014-08-09 MED ORDER — HYDROCORTISONE 1 % EX CREA: TOPICAL_CREAM | Freq: Two times a day (BID) | CUTANEOUS | Status: DC | PRN

## 2014-08-09 NOTE — Consult Note (Signed)
Inpatient Hematology/Oncology Consultation   Name: [name]      MRN: [id number]    Location: A326/A326-01  Date: 08/09/2014 Time:8:07 AM   REFERRING PHYSICIAN:  Velta Addison Mikhail, DO REASON FOR CONSULT: Pleural effusion, lung mass   DIAGNOSIS:  Abnormal CT findings   Pleural effusion, left   Left lung lesion  HISTORY OF PRESENT ILLNESS: 49 year old AAM who presented to the ED with increasing SOB over the preceeding 2 to 3 weeks.  He also notes a significant change in his appetite although he cannot quantify weight loss. He complains of right sided back and rib pain.  He has smoked for many years but says he lost "his taste" for cigarettes about 3 months ago and has not smoked since. He also notes a change in vision in his left eye and was seen in the ED approximately one month ago with an eye exam that was unremarkable.  He has no other neurologic symptoms.  CT performed in the ED on 08/06/2014 showed a large left pleural effusion with compressive atelectasis in the LLL, probable mass at the left hilum. Bony metastatic disease is noted.  There is a R adrenal lesion that is felt to be a benign adenoma. He was also noted to have a 12 mm R thyroid lesion.  Patient is scheduled for a thoracentesis today with cytology.   He is nervous about what is going on and has questions about what can be done.   PAST MEDICAL HISTORY:    Tobacco ABUSE, 35 pack year   ALLERGIES: No Known Allergies    MEDICATIONS: I have reviewed the patient's current medications.     PAST SURGICAL HISTORY Past Surgical History  Procedure Laterality Date  . Appendectomy      FAMILY HISTORY: Family History  Problem Relation Age of Onset  . Diabetes Mother   Father died at 64 from "cancer"  Mother is living. He has 2 sister and 1 brother he states are healthy.  SOCIAL HISTORY:  reports that he has been smoking Cigarettes.  He has been smoking about 0.50 packs per day. He does not have any smokeless  tobacco history on file. He reports that he uses illicit drugs (Marijuana). He reports that he does not drink alcohol.  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 He is not married. He has no children.   Review of Systems  Constitutional: Positive for weight loss and malaise/fatigue. Negative for fever, chills and diaphoresis.  HENT: Negative.   Eyes: Positive for blurred vision. Negative for double vision, photophobia, pain, discharge and redness.  Respiratory: Positive for shortness of breath. Negative for cough, hemoptysis, sputum production and wheezing.   Cardiovascular: Negative.   Gastrointestinal: Positive for constipation. Negative for heartburn, nausea, vomiting, abdominal pain, diarrhea, blood in stool and melena.  Genitourinary: Negative.   Musculoskeletal: Positive for back pain and joint pain. Negative for falls and neck pain.  Skin: Negative.   Neurological: Negative for dizziness, tingling, tremors, sensory change, speech change, focal weakness, seizures, loss of consciousness and weakness.  Endo/Heme/Allergies: Negative.   Psychiatric/Behavioral: Negative.     PHYSICAL EXAMINATION  ECOG PERFORMANCE STATUS: 1 - Symptomatic but completely ambulatory  Filed Vitals:   08/09/14 0553  BP: 135/100  Pulse: 92  Temp: 97.6 F (36.4 C)  Resp: 20    Physical Exam  Constitutional: He is oriented to person, place, and time and well-developed, well-nourished, and in no distress. No  distress.  HENT:  Head: Normocephalic and atraumatic.  Mouth/Throat: Oropharynx is clear and moist. No oropharyngeal exudate.  Eyes: Conjunctivae and EOM are normal. Right eye exhibits no discharge. Left eye exhibits no discharge. No scleral icterus.  Neck: Normal range of motion. Neck supple.  Cardiovascular: Normal rate, regular rhythm and normal heart sounds.  Exam reveals no gallop.   No murmur heard. Pulmonary/Chest: No stridor. He has no wheezes. He has no rales. He exhibits no  tenderness.  Decreased BS throughout but significantly decreased BS L lung  Abdominal: Bowel sounds are normal. He exhibits no distension and no mass. There is no tenderness. There is no rebound and no guarding.  Musculoskeletal: Normal range of motion. He exhibits no edema or tenderness.  Lymphadenopathy:    He has no cervical adenopathy.  Neurological: He is alert and oriented to person, place, and time. No cranial nerve deficit. Coordination normal.  Skin: Skin is warm and dry. No rash noted. He is not diaphoretic. No erythema.  Psychiatric: Mood, memory, affect and judgment normal.    LABORATORY DATA:  Results for orders placed or performed during the hospital encounter of 08/06/14 (from the past 48 hour(s))  CBC     Status: None   Collection Time: 08/08/14  5:48 AM  Result Value Ref Range   WBC 10.0 4.0 - 10.5 K/uL   RBC 4.62 4.22 - 5.81 MIL/uL   Hemoglobin 14.9 13.0 - 17.0 g/dL   HCT 43.5 39.0 - 52.0 %   MCV 94.2 78.0 - 100.0 fL   MCH 32.3 26.0 - 34.0 pg   MCHC 34.3 30.0 - 36.0 g/dL   RDW 12.8 11.5 - 15.5 %   Platelets 302 150 - 400 K/uL  Urinalysis, Routine w reflex microscopic     Status: Abnormal   Collection Time: 08/08/14  4:06 PM  Result Value Ref Range   Color, Urine YELLOW YELLOW   APPearance CLEAR CLEAR   Specific Gravity, Urine >1.030 (H) 1.005 - 1.030   pH 6.0 5.0 - 8.0   Glucose, UA NEGATIVE NEGATIVE mg/dL   Hgb urine dipstick NEGATIVE NEGATIVE   Bilirubin Urine SMALL (A) NEGATIVE   Ketones, ur 40 (A) NEGATIVE mg/dL   Protein, ur TRACE (A) NEGATIVE mg/dL   Urobilinogen, UA 1.0 0.0 - 1.0 mg/dL   Nitrite NEGATIVE NEGATIVE   Leukocytes, UA NEGATIVE NEGATIVE  Urine microscopic-add on     Status: Abnormal   Collection Time: 08/08/14  4:06 PM  Result Value Ref Range   Squamous Epithelial / LPF RARE RARE   WBC, UA 0-2 <3 WBC/hpf   RBC / HPF 0-2 <3 RBC/hpf   Bacteria, UA FEW (A) RARE   Urine-Other MUCOUS PRESENT       RADIOGRAPHY:  08/06/2014 CLINICAL  DATA: Hypotension and tachycardia. Left pleural effusion. Smoker  EXAM: CT CHEST WITH CONTRAST  TECHNIQUE: Multidetector CT imaging of the chest was performed during intravenous contrast administration.  CONTRAST: 33m OMNIPAQUE IOHEXOL 300 MG/ML SOLN  COMPARISON: Chest x-ray today IMPRESSION: Large left pleural effusion with compressive atelectasis left lower lobe. There is a probable mass the left hilum compatible with carcinoma of the lung. PET CT suggested for staging. Bony metastatic disease is noted.  Right adrenal lesion most likely a benign adenoma rather than metastatic disease.  12 mm right thyroid lesion. Thyroid ultrasound recommended.   Electronically Signed  By: CFranchot GalloM.D.  On: 08/06/2014 18:59   PATHOLOGY:  None currently available.  ASSESSMENT:   Large left pleural  effusion Left lung mass Bone metastases SOB Decreased appetite/weight loss Pain  PLAN:   Would proceed with thoracentesis and cytology.  If cytology positive then stage IV disease is confirmed.  All additional imaging can be performed as an outpatient. Given his visual complaints I would recommend an MRI of the brain, but we can do as an outpatient as he is otherwise completely asymptomatic from a neurologic standpoint.   His performance status is a 1 to 2 and therefore he would benefit from systemic therapy.  We will make additional treatment options depending upon his final diagnosis.    Once stable he can be discharged and we will followup with [facility] in the [facility] a few days after discharge. If a diagnosis is not made from his pleural fluid, we can arrange for a biopsy as an outpatient.   For his pain, I would continue a prn short acting medication which we can adjust/change in the outpatient setting.   Thanks for the consult. Please call with any additional problems or concerns. Donald Pore MD    All questions were answered. The patient knows to call the clinic  with any problems, questions or concerns. We can certainly see the patient much sooner if necessary. This note was electronically signed. [place],[employer]annon [employer]

## 2014-08-09 NOTE — Progress Notes (Signed)
1805 Patient requesting skin cream for dry, itchy skin patches to chest, arms & back. MD notified and new order given as follows: 1.) hydrocortisone cream 1 % apply to dry, itchy skin areas BID PRN

## 2014-08-09 NOTE — Progress Notes (Signed)
Thoracentesis complete no signs of distress. 1850 ml amber colored pleural fluid removed.

## 2014-08-09 NOTE — Progress Notes (Signed)
[facility]                                                                              Patient Demographics  [name], is a 49 y.o. male, DOB - 1965/12/18, WGN:[id number]  Admit date - 08/06/2014   Admitting Physician Doree Albee, MD  Outpatient Primary MD for the patient is No PCP Per Patient  LOS - 3   Chief Complaint  Patient presents with  . Tachycardia      HPI on 08/06/2014 by Dr. Hurshel Party This is a 49 year old man who gives a 2 week history of dyspnea on fairly minimal exertion. He says that he gets short of breath when he tries to put his clothes on and walking short distances. Even turning positions in the bed makes him somewhat short of breath. He denies any chest pain, palpitations, limb weakness. He has had a productive cough but white sputum. There is no fever. He is an ex smoker, having quit only 3 months ago. Prior to that he was smoking 1 pack of cigarettes per day since the age of 15. The patient himself denies any weight loss but the sister, who is at the bedside, thinks that he has been losing weight, although she cannot quantify this. The patient denies any hemoptysis or change in voice. The patient has been complaining of abnormal/blurred vision in the left eye. He was in the emergency room approximately one month ago and eye examination by the emergency room physician was unremarkable and he was recommended to follow with ophthalmology as an outpatient. He denies any headaches. He has had right shoulder pain but no back pain. 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 He is now being admitted for further management.  Assessment & Plan   Dyspnea secondary to pleural effusion and lung mass -CT chest: Large left pleural effusion with compressive atelectasis left lower lobe, probable  mass left hilum, bony metastatic disease, right adrenal lesion, 12 mm right thyroid lesion -Pulmonology consulted and appreciated -oncology consulted and appreciated- will wait for thoracentesis and pursue further workup as an outpatient -IR consulted and appreciated for US thoracentesis (will send for cytology)- pending today -Patient currently stable -Patient was given 1 dose of levaquin in the ED, however, does not appear to have pneumoia (currently afebrile, no leukocytosis) -Continue pain control   Essential Hypertension -Currently stable, HCTZ held -Will started medications as needed  Visual deficit of left eye -Ongoing for over 1 month -Will need ophthalmology follow up as an outpatient  Tobacco abuse -Recently stopped smoking as he was "feeling bad"  Code Status: Full  Family Communication: Family at bedside  Disposition Plan: Admitted, pending thoracentesis today 4/25/20016  Time Spent in minutes   25 minutes  Procedures  Thoracentesis  Consults   Interventional radiology Oncology Pulmonology  DVT Prophylaxis  Heparin  Lab Results  Component Value Date   PLT 302 08/08/2014    Medications  Scheduled Meds: . heparin  5,000 Units Subcutaneous 3 times per day  Continuous Infusions:  PRN Meds:.HYDROcodone-acetaminophen, morphine injection, nitroGLYCERIN, ondansetron **OR** ondansetron (ZOFRAN) IV  Antibiotics    Anti-infectives    Start     Dose/Rate Route Frequency Ordered Stop   08/06/14 1730  levofloxacin (LEVAQUIN) IVPB 500 mg     500 mg 100 mL/hr over 60 Minutes Intravenous  Once 08/06/14 1716 08/06/14 1931      Subjective:   Aquiles Bee seen and examined today.  Patient states he has some right shoulder pain.  Denies chest pain.  Feels his breathing is the same.  Denies abdominal pain, N/V/C/D.   Objective:   Filed Vitals:   08/08/14 1501 08/08/14 2025 08/09/14 0553 08/09/14 1303  BP: 134/93 133/88 135/100 123/96  Pulse: 91 85 92 71    Temp:  98.4 F (36.9 C) 97.6 F (36.4 C) 98.9 F (37.2 C)  TempSrc:  Oral Oral Oral  Resp:  '20 20 20  '$ Height:      Weight:      SpO2: 99% 95% 98% 94%    Wt Readings from Last 3 Encounters:  08/07/14 78.654 kg (173 lb 6.4 oz)  07/09/14 70.308 kg (155 lb)  06/14/14 68.04 kg (150 lb)     Intake/Output Summary (Last 24 hours) at 08/09/14 1314 Last data filed at 08/09/14 0840  Gross per 24 hour  Intake    240 ml  Output    450 ml  Net   -210 ml    Exam  General: Well developed, well nourished, no distress  HEENT: NCAT, mucous membranes moist.   Cardiovascular: normal S1/S2, RRR, no murmurs  Respiratory: Diminished breath sounds on left, otherwise clear to auscultation  Abdomen: Soft, nontender, nondistended, + bowel sounds  Extremities: warm dry without cyanosis clubbing or edema  Data Review   Micro Results Recent Results (from the past 240 hour(s))  Blood culture (routine x 2)     Status: None (Preliminary result)   Collection Time: 08/06/14  5:40 PM  Result Value Ref Range Status   Specimen Description BLOOD LEFT ARM  Final   Special Requests BOTTLES DRAWN AEROBIC AND ANAEROBIC 6CC  Final   Culture NO GROWTH 3 DAYS  Final   Report Status PENDING  Incomplete  Blood culture (routine x 2)     Status: None (Preliminary result)   Collection Time: 08/06/14  5:45 PM  Result Value Ref Range Status   Specimen Description BLOOD LEFT HAND  Final   Special Requests BOTTLES DRAWN AEROBIC AND ANAEROBIC 6CC  Final   Culture NO GROWTH 3 DAYS  Final   Report Status PENDING  Incomplete    Radiology Reports Dg Chest 2 View  08/06/2014   CLINICAL DATA:  Tachycardia. Blurred vision in the left on a for 3 weeks. Pain in weakness in the right upper extremity and right-sided chest today. History of smoking.  EXAM: CHEST  2 VIEW  COMPARISON:  None.  FINDINGS: The heart is enlarged. There is significant opacity in the left hemithorax which consists of pleural effusion and  atelectasis or consolidation. The right lung is clear. No pulmonary edema.  IMPRESSION: 1. Cardiomegaly without edema. 2. Significant left lung opacity consistent with atelectasis or infiltrate and pleural effusion.   Electronically Signed   By: Nolon Nations M.D.   On: 08/06/2014 16:41   Ct Chest W Contrast  08/06/2014   CLINICAL DATA:  Hypotension and tachycardia. Left pleural effusion. Smoker  EXAM: CT CHEST WITH CONTRAST  TECHNIQUE: Multidetector CT imaging of the chest was performed  during intravenous contrast administration.  CONTRAST:  91m OMNIPAQUE IOHEXOL 300 MG/ML  SOLN  COMPARISON:  Chest x-ray today  FINDINGS: Image quality degraded by motion.  Large left pleural effusion with compressive atelectasis of the left lower lobe. Left upper lobe is partially expanded. There is a probable mass lesion in the left hilum measuring 28 mm with probable adjacent left hilar adenopathy. Findings are suspicious for carcinoma of the lung. 12 mm right paratracheal lymph node. 12 mm lymph node anterior to the left bronchus. No subcarinal enlarged lymph nodes.  Right lung is clear.  No lung nodule is identified.  Bony destructive lesions compatible with metastatic disease. There is a lesion on the right involving T3. Lesion in the left L1 vertebral body and pedicle extending into the lamina. Left-sided rib lesions.  Right adrenal enlargement measuring 26 mm. This has low density and most likely is adrenal adenoma rather than metastatic disease.  12 mm right thyroid lesion is indeterminate.  IMPRESSION: Large left pleural effusion with compressive atelectasis left lower lobe. There is a probable mass the left hilum compatible with carcinoma of the lung. PET CT suggested for staging. Bony metastatic disease is noted.  Right adrenal lesion most likely a benign adenoma rather than metastatic disease.  12 mm right thyroid lesion.  Thyroid ultrasound recommended.   Electronically Signed   By: CFranchot GalloM.D.   On:  08/06/2014 18:59    CBC  Recent Labs Lab 08/06/14 1609 08/08/14 0548  WBC 11.6* 10.0  HGB 16.0 14.9  HCT 45.7 43.5  PLT 312 302  MCV 92.7 94.2  MCH 32.5 32.3  MCHC 35.0 34.3  RDW 12.6 12.8  LYMPHSABS 2.6  --   MONOABS 1.1*  --   EOSABS 0.8*  --   BASOSABS 0.0  --     Chemistries   Recent Labs Lab 08/06/14 1609 08/07/14 0501  NA 139 137  K 3.7 3.7  CL 103 103  CO2 22 22  GLUCOSE 89 80  BUN 16 17  CREATININE 0.84 0.83  CALCIUM 9.5 9.1  AST 16 14  ALT 9 9  ALKPHOS 84 77  BILITOT 0.9 0.6   ------------------------------------------------------------------------------------------------------------------ estimated creatinine clearance is 103 mL/min (by C-G formula based on Cr of 0.83). ------------------------------------------------------------------------------------------------------------------ No results for input(s): HGBA1C in the last 72 hours. ------------------------------------------------------------------------------------------------------------------ No results for input(s): CHOL, HDL, LDLCALC, TRIG, CHOLHDL, LDLDIRECT in the last 72 hours. ------------------------------------------------------------------------------------------------------------------ No results for input(s): TSH, T4TOTAL, T3FREE, THYROIDAB in the last 72 hours.  Invalid input(s): FREET3 ------------------------------------------------------------------------------------------------------------------ No results for input(s): VITAMINB12, FOLATE, FERRITIN, TIBC, IRON, RETICCTPCT in the last 72 hours.  Coagulation profile No results for input(s): INR, PROTIME in the last 168 hours.  No results for input(s): DDIMER in the last 72 hours.  Cardiac Enzymes No results for input(s): CKMB, TROPONINI, MYOGLOBIN in the last 168 hours.  Invalid input(s): CK ------------------------------------------------------------------------------------------------------------------ Invalid input(s):  POCBNP    Teliyah Royal D.O. on 08/09/2014 at 1:14 PM  Between 7am to 7pm - Pager - [phone] After 7pm go to [email or web address] - password [email or web address]  And look for the night coverage person covering for me after hours  [facility] Office  [phone]

## 2014-08-09 NOTE — Progress Notes (Signed)
He is sent for thoracentesis. No other new treatments at this time.

## 2014-08-10 LAB — GLUCOSE, CAPILLARY: Glucose-Capillary: 96 mg/dL (ref 70–99)

## 2014-08-10 MED ORDER — HYDROCORTISONE 1 % EX CREA: TOPICAL_CREAM | Freq: Two times a day (BID) | CUTANEOUS | Status: AC | PRN

## 2014-08-10 NOTE — Care Management Utilization Note (Signed)
UR completed 

## 2014-08-10 NOTE — Progress Notes (Signed)
Discharge instructions reviewed with patient.

## 2014-08-10 NOTE — Discharge Instructions (Signed)
Pleural Effusion °The lining covering your lungs and the inside of your chest is called the pleura. Usually, the space between the two pleura contains no air and only a thin layer of fluid. A pleural effusion is an abnormal buildup of fluid in the pleural space. °Fluid gathers when there is increased pressure in the lung vessels. This forces fluids out of the lungs and into the pleural space. Vessels may also leak fluids when there are infections, such as pneumonia, or other causes of soreness and redness (inflammation). Fluids leak into the lungs when protein in the blood is low or when certain vessels (lymphatics) are blocked. °Finding a pleural effusion is important because it is usually caused by another disease. In order to treat a pleural effusion, your health care provider needs to find its cause. If left untreated, a large amount of fluid can build up and cause collapse of the lung. °CAUSES  °· Heart failure. °· Infections (pneumonia, tuberculosis), pulmonary embolism, pulmonary infarction. °· Cancer (primary lung and metastatic), asbestosis. °· Liver failure (cirrhosis). °· Nephrotic syndrome, peritoneal dialysis, kidney problems (uremia). °· Collagen vascular disease (systemic lupus erythematosus, rheumatoid arthritis). °· Injury (trauma) to the chest or rupture of the digestive tube (esophagus). °· Material in the chest or pleural space (hemothorax, chylothorax). °· Pancreatitis. °· Surgery. °· Drug reactions. °SYMPTOMS  °A pleural effusion can decrease the amount of space available for breathing and make you short of breath. The fluid can become infected, which may cause pain and fever. Often, the pain is worse when taking a deep breath. The underlying disease (heart failure, pneumonia, blood clot, tuberculosis, cancer) may also cause symptoms. °DIAGNOSIS  °· Your health care provider can usually tell what is wrong by talking to you (taking a history), doing an exam, and taking a routine X-ray. If the  X-ray shows fluid in your chest, often fluid is removed from your chest with a needle for testing (diagnostic thoracentesis). °· Sometimes, more specialized X-rays may be needed. °· Sometimes, a small piece of tissue is removed and examined by a specialist (biopsy). °TREATMENT  °Treatment varies based on what caused the pleural effusion. Treatments include: °· Removing as much fluid as possible using a needle (thoracentesis) to improve the cough and shortness of breath. This is a simple procedure that can be done at bedside. The risks are bleeding, infection, collapse of a lung, or low blood pressure. °· Placing a tube in the chest to drain the effusion (tube thoracostomy). This is often used when there is an infection in the fluid. This is a simple procedure that can often be done at bedside or in a clinic. The procedure may be painful. The risks are the same as using a needle to drain the fluid. The chest tube usually remains for a few days and is connected to suction to improve fluid drainage. After placement, the tube usually does not cause much discomfort. °· Surgical removal of fibrous debris in and around the pleural space (decortication). This may be done with a flexible telescope (thoracoscope) through a small or large cut (incision). This is helpful for patients who have fibrosis or scar tissue that prevents complete lung expansion. The risks are infection, blood loss, and side effects from general anesthesia. °· Sometimes, a procedure called pleurodesis is done. A chest tube is placed and the fluid is drained. Next, an agent (tetracycline, talc powder) is added to the pleural space. This causes the lung and chest wall to stick together (adhesion). This leaves no   potential space for fluid to build up. The risks include infection, blood loss, and side effects from general anesthesia. °· If the effusion is caused by infection, it may be treated with antibiotics and may improve without draining. °HOME CARE  INSTRUCTIONS  °· Take any medicines exactly as prescribed. °· Follow up with your health care provider as directed. °· Monitor your exercise capacity (the amount of walking you can do before you get short of breath). °· Do not use any tobacco products including cigarettes, chewing tobacco, or electronic cigarettes. °SEEK MEDICAL CARE IF:  °· Your exercise capacity seems to get worse or does not improve with time. °· You do not recover from your illness. °· You have drainage, redness, swelling, or pain at any incision or puncture sites. °SEEK IMMEDIATE MEDICAL CARE IF:  °· Shortness of breath or chest pain develops or gets worse. °· You have a fever. °· You develop a new cough, especially if the mucus (phlegm) is discolored. °MAKE SURE YOU:  °· Understand these instructions. °· Will watch your condition. °· Will get help right away if you are not doing well or get worse. °Document Released: 04/02/2005 Document Revised: 08/17/2013 Document Reviewed: 11/22/2006 °ExitCare® Patient Information ©2015 [employer]. This information is not intended to replace advice given to you by your health care provider. Make sure you discuss any questions you have with your health care provider. ° °

## 2014-08-10 NOTE — Care Management Note (Signed)
    Page 1 of 1   08/10/2014     12:19:56 PM CARE MANAGEMENT NOTE 08/10/2014  Patient:  [name],[name]   Account Number:  [id number]  Date Initiated:  08/10/2014  Documentation initiated by:  CHILDRESS,JESSICA  Subjective/Objective Assessment:   Pt is from home, independent at baseline. Pt has no insurance and is listed as having no PCP. Pt will be f/u with Dr. Whitney Muse at the [facility]. Pt says he has been to the [place], f/u appointment will be made with them.     Action/Plan:   Pt has been referred to the financial counselor and has been seen. Pt will not need MATCH voucher for meds.   Anticipated DC Date:  08/10/2014   Anticipated DC Plan:  HOME/SELF CARE  In-house referral  Financial Counselor      DC Planning Services  CM consult      Choice offered to / List presented to:             Status of service:  Completed, signed off Medicare Important Message given?   (If response is "NO", the following Medicare IM given date fields will be blank) Date Medicare IM given:   Medicare IM given by:   Date Additional Medicare IM given:   Additional Medicare IM given by:    Discharge Disposition:  HOME/SELF CARE  Per UR Regulation:    If discussed at Long Length of Stay Meetings, dates discussed:    Comments:  08/10/2014 [place], RN, MSN, CM

## 2014-08-10 NOTE — Progress Notes (Signed)
He says he feels better. He is doing better as far as his breathing after his thoracentesis. He is being set up for outpatient workup if his thoracentesis is not positive for cancer cells. I will plan to sign off at this point. Thanks for allowing me to see him with you

## 2014-08-10 NOTE — Clinical Social Work Note (Signed)
CSW received referral for financial assistance. Notified financial counselor who plans to follow up with pt. Will sign off, but can be reconsulted if needed.  Benay Pike, [place]

## 2014-08-10 NOTE — Discharge Summary (Signed)
Physician Discharge Summary  [name] ELF:[id number] DOB: 08-10-1965 DOA: 08/06/2014  PCP: No PCP Per Patient  Admit date: 08/06/2014 Discharge date: 08/10/2014  Time spent: 45 minutes  Recommendations for Outpatient Follow-up:  Patient will be discharged to home. Patient will need to follow up with primary care provider and Dr. Whitney Muse, oncologist, within one week of discharge Patient should continue medications as prescribed. Patient should follow a regular diet.  Discharge Diagnoses:  Dyspnea secondary to pleural effusion and lung mass Essential hypertension Visual deficit of the left eye Tobacco abuse Questionable dermatitis  Discharge Condition: Stable  Diet recommendation: Regular  Filed Weights   08/07/14 1100  Weight: 78.654 kg (173 lb 6.4 oz)    History of present illness:  on 08/06/2014 by Dr. Hurshel Party This is a 49 year old man who gives a 2 week history of dyspnea on fairly minimal exertion. He says that he gets short of breath when he tries to put his clothes on and walking short distances. Even turning positions in the bed makes him somewhat short of breath. He denies any chest pain, palpitations, limb weakness. He has had a productive cough but white sputum. There is no fever. He is an ex smoker, having quit only 3 months ago. Prior to that he was smoking 1 pack of cigarettes per day since the age of 49. The patient himself denies any weight loss but the sister, who is at the bedside, thinks that he has been losing weight, although she cannot quantify this. The patient denies any hemoptysis or change in voice. The patient has been complaining of abnormal/blurred vision in the left eye. He was in the emergency room approximately one month ago and eye examination by the emergency room physician was unremarkable and he was recommended to follow with ophthalmology as an outpatient. He denies any headaches. He has had right shoulder pain but no back pain. Evaluation  in the emergency room shows a patient with a large left pleural effusion with underlying lung mass and what appears to be bony metastatic disease involving T3 and L1 as well as left-sided rib lesions. He is now being admitted for further management.  Hospital Course:  Dyspnea secondary to pleural effusion and lung mass -CT chest: Large left pleural effusion with compressive atelectasis left lower lobe, probable mass left hilum, bony metastatic disease, right adrenal lesion, 12 mm right thyroid lesion -Pulmonology consulted and appreciated -oncology consulted and appreciated- will wait for thoracentesis and pursue further workup as an outpatient -IR consulted and appreciated for US thoracentesis (will send for cytology)- 1.85L removed, labs,culture, and cytology pending -Patient currently stable -Patient was given 1 dose of levaquin in the ED, however, does not appear to have pneumoia (currently afebrile, no leukocytosis) -Patient will need to follow up with Oncology, Dr. Whitney Muse, to discuss further workup and results of thoracentesis  Essential Hypertension -Currently stable, HCTZ held -Will need to establish care with at PCP  Visual deficit of left eye -Ongoing for over 1 month -Will need ophthalmology follow up as an outpatient  Tobacco abuse -Recently stopped smoking as he was "feeling bad"  ?Dermatitis -Has been ongoing for a while, as per patient -Sometimes itchy -Recommended patient follow up with dermatologist  Procedures  Thoracentesis  Consults  Interventional radiology Oncology Pulmonology  Discharge Exam: Filed Vitals:   08/10/14 0537  BP: 125/93  Pulse: 86  Temp: 98.5 F (36.9 C)  Resp: 20   Exam  General: Well developed, well nourished, no distress  HEENT: NCAT,  mucous membranes moist.    Cardiovascular: normal S1/S2, RRR, no murmurs  Respiratory: Better aeration and breath sounds on the left, otherwise clear  Abdomen: Soft, nontender,  nondistended, + bowel sounds  Extremities: warm dry without cyanosis clubbing or edema  Psych: flat affected  Discharge Instructions      Discharge Instructions    Discharge instructions    Complete by:  As directed   Patient will be discharged to home.  Patient will need to follow up with primary care provider and Dr. Whitney Muse, oncologist, within one week of discharge  Patient should continue medications as prescribed.  Patient should follow a regular diet.            Medication List    STOP taking these medications        Aspirin-Salicylamide-Caffeine 144-31-54 MG Tabs     dexamethasone 4 MG tablet  Commonly known as:  DECADRON     ibuprofen 800 MG tablet  Commonly known as:  ADVIL,MOTRIN     naproxen sodium 220 MG tablet  Commonly known as:  ANAPROX      TAKE these medications        hydrochlorothiazide 12.5 MG capsule  Commonly known as:  MICROZIDE  Take 1 capsule (12.5 mg total) by mouth daily.     HYDROcodone-acetaminophen 5-325 MG per tablet  Commonly known as:  NORCO/VICODIN  Take 1 tablet by mouth every 4 (four) hours as needed.     hydrocortisone cream 1 %  Apply topically 2 (two) times daily as needed for itching.       No Known Allergies Follow-up Information    Follow up with Molli Hazard, MD. Schedule an appointment as soon as possible for a visit in 1 week.   Specialties:  Hematology and Oncology, Oncology   Why:  Hospital follow up, test results   Contact information:   [address] [place] [id number] [phone]       Follow up with Primary Care physician. Schedule an appointment as soon as possible for a visit in 1 week.   Why:  Hospital follow up       The results of significant diagnostics from this hospitalization (including imaging, microbiology, ancillary and laboratory) are listed below for reference.    Significant Diagnostic Studies: Dg Chest 1 View  08/09/2014   CLINICAL DATA:  Status post left-sided  thoracentesis  EXAM: CHEST  1 VIEW  COMPARISON:  Chest radiograph and chest CT August 06, 2014  FINDINGS: Most of the left effusion has been removed by thoracentesis. No pneumothorax. A small left effusion remains with patchy atelectasis in the left mid and lower lung zones. There is moderate consolidation in the medial left base. There is minimal right base atelectasis. Lungs are otherwise clear. Heart size and pulmonary vascularity are within normal limits. There is apparent left hilar adenopathy, appreciated on recent CT.  IMPRESSION: No pneumothorax following thoracentesis. Medial left base consolidation with small left effusion and patchy left mid lower lung zone atelectasis. Minimal right base atelectasis. Left hilar adenopathy.   Electronically Signed   By: Lowella Grip III M.D.   On: 08/09/2014 15:13   Dg Chest 2 View  08/06/2014   CLINICAL DATA:  Tachycardia. Blurred vision in the left on a for 3 weeks. Pain in weakness in the right upper extremity and right-sided chest today. History of smoking.  EXAM: CHEST  2 VIEW  COMPARISON:  None.  FINDINGS: The heart is enlarged. There is significant opacity in  the left hemithorax which consists of pleural effusion and atelectasis or consolidation. The right lung is clear. No pulmonary edema.  IMPRESSION: 1. Cardiomegaly without edema. 2. Significant left lung opacity consistent with atelectasis or infiltrate and pleural effusion.   Electronically Signed   By: Nolon Nations M.D.   On: 08/06/2014 16:41   Ct Chest W Contrast  08/06/2014   CLINICAL DATA:  Hypotension and tachycardia. Left pleural effusion. Smoker  EXAM: CT CHEST WITH CONTRAST  TECHNIQUE: Multidetector CT imaging of the chest was performed during intravenous contrast administration.  CONTRAST:  24m OMNIPAQUE IOHEXOL 300 MG/ML  SOLN  COMPARISON:  Chest x-ray today  FINDINGS: Image quality degraded by motion.  Large left pleural effusion with compressive atelectasis of the left lower lobe.  Left upper lobe is partially expanded. There is a probable mass lesion in the left hilum measuring 28 mm with probable adjacent left hilar adenopathy. Findings are suspicious for carcinoma of the lung. 12 mm right paratracheal lymph node. 12 mm lymph node anterior to the left bronchus. No subcarinal enlarged lymph nodes.  Right lung is clear.  No lung nodule is identified.  Bony destructive lesions compatible with metastatic disease. There is a lesion on the right involving T3. Lesion in the left L1 vertebral body and pedicle extending into the lamina. Left-sided rib lesions.  Right adrenal enlargement measuring 26 mm. This has low density and most likely is adrenal adenoma rather than metastatic disease.  12 mm right thyroid lesion is indeterminate.  IMPRESSION: Large left pleural effusion with compressive atelectasis left lower lobe. There is a probable mass the left hilum compatible with carcinoma of the lung. PET CT suggested for staging. Bony metastatic disease is noted.  Right adrenal lesion most likely a benign adenoma rather than metastatic disease.  12 mm right thyroid lesion.  Thyroid ultrasound recommended.   Electronically Signed   By: CFranchot GalloM.D.   On: 08/06/2014 18:59   UKoreaThoracentesis Asp Pleural Space W/img Guide  08/09/2014   CLINICAL DATA:  Left pleural effusion  EXAM: ULTRASOUND GUIDED LEFT THORACENTESIS  COMPARISON:  Plain film and CT 08/06/2014  PROCEDURE: An ultrasound guided thoracentesis was thoroughly discussed with the patient and questions answered. The benefits, risks, alternatives and complications were also discussed. The patient understands and wishes to proceed with the procedure. Written consent was obtained.  Ultrasound was performed to localize and mark an adequate pocket of fluid in the left posterior chest. The area was then prepped and draped in the normal sterile fashion. 1% Lidocaine was used for local anesthesia. Under ultrasound guidance a 19 gauge Yueh catheter  was introduced. Thoracentesis was performed. The catheter was removed and a dressing applied.  Complications:  None.  FINDINGS: A total of approximately 1.85 L of amber fluid was removed. A fluid sample wassent for laboratory analysis.  IMPRESSION: Successful ultrasound guided left thoracentesis yielding 1.85 L of pleural fluid.   Electronically Signed   By: KRolm BaptiseM.D.   On: 08/09/2014 15:13    Microbiology: Recent Results (from the past 240 hour(s))  Blood culture (routine x 2)     Status: None (Preliminary result)   Collection Time: 08/06/14  5:40 PM  Result Value Ref Range Status   Specimen Description BLOOD LEFT ARM  Final   Special Requests BOTTLES DRAWN AEROBIC AND ANAEROBIC 6CC  Final   Culture NO GROWTH 4 DAYS  Final   Report Status PENDING  Incomplete  Blood culture (routine x 2)  Status: None (Preliminary result)   Collection Time: 08/06/14  5:45 PM  Result Value Ref Range Status   Specimen Description BLOOD LEFT HAND  Final   Special Requests BOTTLES DRAWN AEROBIC AND ANAEROBIC 6CC  Final   Culture NO GROWTH 4 DAYS  Final   Report Status PENDING  Incomplete  Body fluid culture     Status: None (Preliminary result)   Collection Time: 08/09/14  2:30 PM  Result Value Ref Range Status   Specimen Description FLUID LEFT PLEURAL  Final   Special Requests NONE  Final   Gram Stain   Final    RARE WBC PRESENT, PREDOMINANTLY MONONUCLEAR NO ORGANISMS SEEN Performed at [facility]    Culture PENDING  Incomplete   Report Status PENDING  Incomplete     Labs: Basic Metabolic Panel:  Recent Labs Lab 08/06/14 1609 08/07/14 0501  NA 139 137  K 3.7 3.7  CL 103 103  CO2 22 22  GLUCOSE 89 80  BUN 16 17  CREATININE 0.84 0.83  CALCIUM 9.5 9.1   Liver Function Tests:  Recent Labs Lab 08/06/14 1609 08/07/14 0501  AST 16 14  ALT 9 9  ALKPHOS 84 77  BILITOT 0.9 0.6  PROT 8.1 7.8  ALBUMIN 3.8 3.7   No results for input(s): LIPASE, AMYLASE in the last 168  hours. No results for input(s): AMMONIA in the last 168 hours. CBC:  Recent Labs Lab 08/06/14 1609 08/08/14 0548  WBC 11.6* 10.0  NEUTROABS 7.0  --   HGB 16.0 14.9  HCT 45.7 43.5  MCV 92.7 94.2  PLT 312 302   Cardiac Enzymes: No results for input(s): CKTOTAL, CKMB, CKMBINDEX, TROPONINI in the last 168 hours. BNP: BNP (last 3 results)  Recent Labs  08/06/14 1609  BNP 16.0    ProBNP (last 3 results) No results for input(s): PROBNP in the last 8760 hours.  CBG:  Recent Labs Lab 08/06/14 1549  GLUCAP 71       Signed:  Gorgeous Floyd  [facility] 08/10/2014, 11:13 AM m

## 2014-08-11 ENCOUNTER — Emergency Department (HOSPITAL_COMMUNITY): Payer: Self-pay

## 2014-08-11 ENCOUNTER — Encounter (HOSPITAL_COMMUNITY): Payer: Self-pay | Admitting: Emergency Medicine

## 2014-08-11 ENCOUNTER — Emergency Department (HOSPITAL_COMMUNITY)
Admission: EM | Admit: 2014-08-11 | Discharge: 2014-08-11 | Disposition: A | Discharge status: Home / Self Care | Payer: Self-pay | Attending: Emergency Medicine | Admitting: Emergency Medicine

## 2014-08-11 DIAGNOSIS — M25511 Pain in right shoulder: Secondary | ICD-10-CM | POA: Insufficient documentation

## 2014-08-11 DIAGNOSIS — Z79899 Other long term (current) drug therapy: Secondary | ICD-10-CM | POA: Insufficient documentation

## 2014-08-11 DIAGNOSIS — C801 Malignant (primary) neoplasm, unspecified: Secondary | ICD-10-CM | POA: Insufficient documentation

## 2014-08-11 DIAGNOSIS — Z9889 Other specified postprocedural states: Secondary | ICD-10-CM | POA: Insufficient documentation

## 2014-08-11 DIAGNOSIS — Z720 Tobacco use: Secondary | ICD-10-CM | POA: Insufficient documentation

## 2014-08-11 DIAGNOSIS — C799 Secondary malignant neoplasm of unspecified site: Secondary | ICD-10-CM

## 2014-08-11 DIAGNOSIS — R000 Tachycardia, unspecified: Secondary | ICD-10-CM | POA: Insufficient documentation

## 2014-08-11 LAB — BASIC METABOLIC PANEL
Anion gap: 14 (ref 5–15)
BUN: 18 mg/dL (ref 6–23)
CALCIUM: 9.6 mg/dL (ref 8.4–10.5)
CHLORIDE: 101 mmol/L (ref 96–112)
CO2: 22 mmol/L (ref 19–32)
Creatinine, Ser: 0.77 mg/dL (ref 0.50–1.35)
GFR calc Af Amer: >90 mL/min (ref >90)
GFR calc non Af Amer: >90 mL/min (ref >90)
GLUCOSE: 106 mg/dL — AB (ref 70–99)
Potassium: 3.9 mmol/L (ref 3.5–5.1)
Sodium: 137 mmol/L (ref 135–145)

## 2014-08-11 LAB — CBC WITH DIFFERENTIAL/PLATELET
Basophils Absolute: 0 10*3/uL (ref 0.0–0.1)
Basophils Relative: 0 % (ref 0–1)
EOS PCT: 5 % (ref 0–5)
Eosinophils Absolute: 0.5 10*3/uL (ref 0.0–0.7)
HCT: 45.5 % (ref 39.0–52.0)
Hemoglobin: 16.3 g/dL (ref 13.0–17.0)
LYMPHS ABS: 1.7 10*3/uL (ref 0.7–4.0)
Lymphocytes Relative: 15 % (ref 12–46)
MCH: 32.9 pg (ref 26.0–34.0)
MCHC: 35.8 g/dL (ref 30.0–36.0)
MCV: 91.9 fL (ref 78.0–100.0)
MONO ABS: 1.3 10*3/uL — AB (ref 0.1–1.0)
Monocytes Relative: 12 % (ref 3–12)
Neutro Abs: 7.8 10*3/uL — ABNORMAL HIGH (ref 1.7–7.7)
Neutrophils Relative %: 68 % (ref 43–77)
Platelets: 299 10*3/uL (ref 150–400)
RBC: 4.95 MIL/uL (ref 4.22–5.81)
RDW: 12.7 % (ref 11.5–15.5)
WBC: 11.3 10*3/uL — AB (ref 4.0–10.5)

## 2014-08-11 LAB — CULTURE, BLOOD (ROUTINE X 2)
Culture: NO GROWTH
Culture: NO GROWTH

## 2014-08-11 MED ORDER — LEVOFLOXACIN 500 MG PO TABS: 500.0000 mg | ORAL_TABLET | Freq: Every day | ORAL | Status: DC

## 2014-08-11 MED ORDER — SODIUM CHLORIDE 0.9 % IV BOLUS (SEPSIS)
1000.0000 mL | Freq: Once | INTRAVENOUS | Status: AC
  Administered 2014-08-11: 1000 mL via INTRAVENOUS

## 2014-08-11 MED ORDER — HYDROCODONE-ACETAMINOPHEN 5-325 MG PO TABS: 1.0000 | ORAL_TABLET | ORAL | Status: DC | PRN

## 2014-08-11 MED ORDER — LEVOFLOXACIN 500 MG PO TABS
500.0000 mg | ORAL_TABLET | Freq: Once | ORAL | Status: AC
  Administered 2014-08-11: 500 mg via ORAL
  Filled 2014-08-11: qty 1

## 2014-08-11 MED ORDER — IOHEXOL 350 MG/ML SOLN
100.0000 mL | Freq: Once | INTRAVENOUS | Status: AC | PRN
  Administered 2014-08-11: 100 mL via INTRAVENOUS

## 2014-08-11 MED ORDER — HYDROMORPHONE HCL 1 MG/ML IJ SOLN
1.0000 mg | Freq: Once | INTRAMUSCULAR | Status: AC
  Administered 2014-08-11: 1 mg via INTRAVENOUS
  Filled 2014-08-11: qty 1

## 2014-08-11 NOTE — Discharge Instructions (Signed)
It is very important that you call the cancer Center and Dr. Whitney Muse for follow-up.  Please return to the emergency department for any new or worsening symptoms.

## 2014-08-11 NOTE — ED Provider Notes (Signed)
CSN: [id number]     Arrival date & time 08/11/14  0454 History   First MD Initiated Contact with Patient 08/11/14 [phone]     Chief Complaint  Patient presents with  . Flank Pain      HPI Patient was recently hospitalized for exertional shortness of breath and a new left-sided pleural effusion.  He was found to have a left hilar mass and underwent thoracentesis on April 25.  He was discharged from the hospital on April 26.  He returns today, one day later, with right shoulder pain.  He states his long standing history of recurrent right shoulder pain.  States his pain has been worse since tonight.  He also woke up and was having some mild chest discomfort.  He was found to be tachycardic on arrival with a heart rate of 1:30 after ambulating from the waiting room to the emergency department examination room.  He denies chest pain at this time.  No shortness of breath at this time.  He denies palpitations.  Pleural fluid cytology is still pending at this time.  Patient reports he does not have a scheduled oncology visit at this time.   History reviewed. No pertinent past medical history. Past Surgical History  Procedure Laterality Date  . Appendectomy     Family History  Problem Relation Age of Onset  . Diabetes Mother    History  Substance Use Topics  . Smoking status: Current Every Day Smoker -- 0.50 packs/day    Types: Cigarettes  . Smokeless tobacco: Not on file  . Alcohol Use: No     Comment: occ    Review of Systems  All other systems reviewed and are negative.     Allergies  Review of patient's allergies indicates no known allergies.  Home Medications   Prior to Admission medications   Medication Sig Start Date End Date Taking? Authorizing Provider  hydrochlorothiazide (MICROZIDE) 12.5 MG capsule Take 1 capsule (12.5 mg total) by mouth daily. Patient not taking: Reported on 08/06/2014 06/14/14   Lily Kocher, PA-C  HYDROcodone-acetaminophen (NORCO/VICODIN) 5-325 MG per  tablet Take 1 tablet by mouth every 4 (four) hours as needed. Patient not taking: Reported on 07/09/2014 06/14/14   Lily Kocher, PA-C  hydrocortisone cream 1 % Apply topically 2 (two) times daily as needed for itching. 08/10/14   Maryann Mikhail, DO   BP 124/96 mmHg  Pulse 130  Temp(Src) 97.2 F (36.2 C)  Resp 18  Ht '5\' 6"'$  (1.676 m)  Wt 170 lb (77.111 kg)  BMI 27.45 kg/m2  SpO2 97% Physical Exam  Constitutional: He is oriented to person, place, and time. He appears well-developed and well-nourished.  HENT:  Head: Normocephalic and atraumatic.  Eyes: EOM are normal.  Neck: Normal range of motion.  Cardiovascular: Regular rhythm, normal heart sounds and intact distal pulses.  Tachycardia present.   Pulmonary/Chest: Effort normal and breath sounds normal. No respiratory distress.  Abdominal: Soft. He exhibits no distension. There is no tenderness.  Musculoskeletal:       Right shoulder: He exhibits tenderness and pain. He exhibits no swelling, no effusion, no deformity, normal pulse and normal strength.  Neurological: He is alert and oriented to person, place, and time.  Skin: Skin is warm and dry.  Psychiatric: He has a normal mood and affect. Judgment normal.  Nursing note and vitals reviewed.   ED Course  Procedures (including critical care time) Labs Review Labs Reviewed  CBC WITH DIFFERENTIAL/PLATELET - Abnormal; Notable for the following:  WBC 11.3 (*)    Neutro Abs 7.8 (*)    Monocytes Absolute 1.3 (*)    All other components within normal limits  BASIC METABOLIC PANEL - Abnormal; Notable for the following:    Glucose, Bld 106 (*)    All other components within normal limits    Imaging Review Dg Chest 1 View  08/09/2014   CLINICAL DATA:  Status post left-sided thoracentesis  EXAM: CHEST  1 VIEW  COMPARISON:  Chest radiograph and chest CT August 06, 2014  FINDINGS: Most of the left effusion has been removed by thoracentesis. No pneumothorax. A small left effusion  remains with patchy atelectasis in the left mid and lower lung zones. There is moderate consolidation in the medial left base. There is minimal right base atelectasis. Lungs are otherwise clear. Heart size and pulmonary vascularity are within normal limits. There is apparent left hilar adenopathy, appreciated on recent CT.  IMPRESSION: No pneumothorax following thoracentesis. Medial left base consolidation with small left effusion and patchy left mid lower lung zone atelectasis. Minimal right base atelectasis. Left hilar adenopathy.   Electronically Signed   By: Lowella Grip III M.D.   On: 08/09/2014 15:13   Dg Chest 2 View  08/11/2014   CLINICAL DATA:  Acute onset of right shoulder pain and weakness. Initial encounter.  EXAM: CHEST  2 VIEW  COMPARISON:  Chest radiograph performed 08/09/2014, and CT of the chest performed 08/06/2014  FINDINGS: The lungs are well-aerated. An increasing small left pleural effusion is noted, with patchy left basilar airspace opacity. This may reflect postobstructive pneumonia, superimposed on the patient's known left hilar lung mass. The right lung appears clear.  The heart is normal in size. No acute osseous abnormalities are seen.  IMPRESSION: Increasing small left pleural effusion, with patchy left basilar airspace opacity. This may reflect postobstructive pneumonia, superimposed on the patient's known left hilar lung mass.   Electronically Signed   By: Garald Balding M.D.   On: 08/11/2014 06:13   Dg Shoulder Right  08/11/2014   CLINICAL DATA:  Acute onset of right shoulder pain and weakness. Initial encounter.  EXAM: RIGHT SHOULDER - 2+ VIEW  COMPARISON:  Right shoulder radiographs performed 06/14/2014  FINDINGS: There is no evidence of fracture or dislocation. The right humeral head is seated within the glenoid fossa. The acromioclavicular joint is unremarkable in appearance. No significant soft tissue abnormalities are seen. The visualized portions of the right lung  are clear.  IMPRESSION: No evidence of fracture or dislocation.   Electronically Signed   By: Garald Balding M.D.   On: 08/11/2014 06:11   US Thoracentesis Asp Pleural Space W/img Guide  08/09/2014   CLINICAL DATA:  Left pleural effusion  EXAM: ULTRASOUND GUIDED LEFT THORACENTESIS  COMPARISON:  Plain film and CT 08/06/2014  PROCEDURE: An ultrasound guided thoracentesis was thoroughly discussed with the patient and questions answered. The benefits, risks, alternatives and complications were also discussed. The patient understands and wishes to proceed with the procedure. Written consent was obtained.  Ultrasound was performed to localize and mark an adequate pocket of fluid in the left posterior chest. The area was then prepped and draped in the normal sterile fashion. 1% Lidocaine was used for local anesthesia. Under ultrasound guidance a 19 gauge Yueh catheter was introduced. Thoracentesis was performed. The catheter was removed and a dressing applied.  Complications:  None.  FINDINGS: A total of approximately 1.85 L of amber fluid was removed. A fluid sample wassent for laboratory analysis.  IMPRESSION: Successful ultrasound guided left thoracentesis yielding 1.85 L of pleural fluid.   Electronically Signed   By: Rolm Baptise M.D.   On: 08/09/2014 15:13  I personally reviewed the imaging tests through PACS system I reviewed available ER/hospitalization records through the EMR    EKG Interpretation None      MDM   Final diagnoses:  None    In regards to his right shoulder this is likely some sort of muscular pain.  He has had this recurrently and has had several ER visits for this.  He has no unilateral arm swelling to suggest DVT.  He is extremely tachycardic however with a resting heart rate of 115 and exertional heart rate of 130.  Repeat chest x-ray shows concerning left lung infiltrate versus atelectasis.  Given his elevated heart rate which seems to be a new process for him he will undergo  repeat CT of his chest this time with PE protocol to evaluate for PE with pulmonary infarct versus pneumonia.  7:32 AM CT demonstrates possible compressive atelectasis versus developing infiltrate.  His effusion does appear to be increasing in size again.  He is not hypoxic.  He feels much better after IV fluids and pain medication.  In regards to his primary complaint which is his shoulder pain this seems to be musculoskeletal.  He feels better after Dilaudid.  He has a normal pulse in his right upper extremity.  CT is negative for PE.  I ambulated with the patient and his heart rate has significantly improved.  This may been volume depletion as the only thing he received was IV fluids and pain medication.  He had no increasing shortness of breath or increasing work of breathing with ambulating around the emergency department.  His heart rate was 94 and after ambulation was 101.  This is significantly improved.  Discharge home on Levaquin for possible developing postobstructive pneumonia.  Patient understands to return to the emergency department for new or worsening symptoms.  I stressed the importance since patient that he needs to see the oncologist for follow-up as he is still vague about his understanding of his new diagnosis of metastatic cancer.  At some point he will also likely need recurrent thoracentesis as I suspect his effusion will increase in size again to the point where it requires drainage for symptom control.  We are not at this point currently.   Jola Schmidt, MD 08/11/14 [phone]

## 2014-08-11 NOTE — ED Notes (Signed)
Pt c/o left flank pain since 2am

## 2014-08-13 ENCOUNTER — Encounter (HOSPITAL_COMMUNITY): Payer: Self-pay | Admitting: Oncology

## 2014-08-13 ENCOUNTER — Encounter (HOSPITAL_COMMUNITY): Payer: Medicaid Other | Attending: Oncology | Admitting: Oncology

## 2014-08-13 VITALS — BP 127/98 | HR 112 | Temp 97.8°F | Resp 18 | Wt 161.4 lb

## 2014-08-13 DIAGNOSIS — Z87891 Personal history of nicotine dependence: Secondary | ICD-10-CM

## 2014-08-13 DIAGNOSIS — H538 Other visual disturbances: Secondary | ICD-10-CM

## 2014-08-13 DIAGNOSIS — R112 Nausea with vomiting, unspecified: Secondary | ICD-10-CM

## 2014-08-13 DIAGNOSIS — J90 Pleural effusion, not elsewhere classified: Secondary | ICD-10-CM

## 2014-08-13 DIAGNOSIS — R918 Other nonspecific abnormal finding of lung field: Secondary | ICD-10-CM | POA: Diagnosis present

## 2014-08-13 LAB — BODY FLUID CULTURE: Culture: NO GROWTH

## 2014-08-13 MED ORDER — PROCHLORPERAZINE MALEATE 10 MG PO TABS: 10.0000 mg | ORAL_TABLET | Freq: Four times a day (QID) | ORAL | Status: DC | PRN

## 2014-08-13 MED ORDER — HYDROCODONE-ACETAMINOPHEN 5-325 MG PO TABS
1.0000 | ORAL_TABLET | ORAL | Status: DC | PRN
Start: 2014-08-13 — End: 2014-08-18

## 2014-08-13 NOTE — Assessment & Plan Note (Signed)
S/P thoracentesis confirming adenocarcinoma without enough tissue for EGFR/ALK testing.  Pathologist will send tissue for testing anyway, but there is not likely enough for testing.  Suspect lung primary but not definitively staining as lung primary (likely irrelevant given clinical picture).  MRI brain due to neurologic issues and suspect metastatic disease.  PET scan for complete staging.  Referral to CTS for more tissue sampling (ALK, EGFR, and/or [facility] testing) for treatment guidance and port placement.  Return in 2-3 weeks for follow-up.  We will work him in sooner when more data is collected.

## 2014-08-13 NOTE — Progress Notes (Signed)
No PCP Per Patient No address on file  Lung mass - Plan: Naproxen Sodium (ALEVE PO), Aspirin-Salicylamide-Caffeine (BC HEADACHE POWDER PO), MR Brain W Wo Contrast, NM PET Image Initial (PI) Skull Base To Thigh, Ambulatory referral to Cardiothoracic Surgery, HYDROcodone-acetaminophen (NORCO/VICODIN) 5-325 MG per tablet  Pleural effusion, left - Plan: Naproxen Sodium (ALEVE PO), Aspirin-Salicylamide-Caffeine (BC HEADACHE POWDER PO), MR Brain W Wo Contrast, NM PET Image Initial (PI) Skull Base To Thigh, Ambulatory referral to Cardiothoracic Surgery  Non-intractable vomiting with nausea, vomiting of unspecified type - Plan: prochlorperazine (COMPAZINE) 10 MG tablet  CURRENT THERAPY: Work-up for newly discovered left lung lesion.  INTERVAL HISTORY: [name] 49 y.o. male returns for followup of newly discovered left lung lesion with pleural effusion (?malignant) after being discharged from the [facility] on 08/10/2014.   I personally reviewed and went over laboratory results with the patient.  The results are noted within this dictation.  I personally reviewed and went over radiographic studies with the patient.  The results are noted within this dictation.  Ct angio of chest demonstrates metastatic disease: 1. No evidence of pulmonary embolus. 2. Small to moderate left-sided pleural effusion noted, with partial consolidation of much of the left lower lobe. This raises concern for postobstructive pneumonia, given the masses at the left hilum. The largest mass measures 3.6 cm 3.6 x 2.1 cm, while a smaller 2.6 cm mass is seen more inferiorly at the left hilum. 3. Mass-effect on the pulmonary artery to the left lower lobe and its branches, which passes between the masses. Vague fluid attenuation tracking along the left-sided mediastinum raises concern for a malignant pericardial effusion, given its focal appearance. Underlying soft tissue inflammation within the  mediastinum. 4. 1.4 cm right-sided peribronchial node ulcer raises concern for metastatic disease. Aortopulmonary window nodes measure up to 1.4 cm in short axis. 5. Prominent 2.0 cm lytic mass noted within the left side of vertebral body T3, without definite evidence of mass-effect on the spinal canal this time. This is compatible with bony metastatic Disease.  [name] is seen in follow-up after being discharged from the hospital.  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  There is unlikely enough tissue for ALK and EGFR testing per pathology.  The specimen will be sent anyway.  [name] reports a 45 pack year history of tobacco abuse smoking 1.5 ppd.  He was an Radiographer, therapeutic.  He reports that he has lost about 20 lbs x 6 months.  He reports that he quite smoking 2 months ago because he "just didn't want them anymore."  He admits to double vision and slurred speech.  He is seen closing one eye during discussion and having difficulty focusing his eye sight.    History reviewed. No pertinent past medical history.  has Lung mass; Pleural effusion, left; Tobacco abuse; and Hypertension on his problem list.     has No Known Allergies.  Mr. Paolini had no medications administered during this visit.  Past Surgical History  Procedure Laterality Date  . Appendectomy      Denies any headaches, dizziness, double vision, fevers, chills, night sweats, nausea, vomiting, diarrhea, constipation, chest pain, heart palpitations, shortness of breath, blood in stool, black tarry stool, urinary pain, urinary burning, urinary frequency, hematuria.   PHYSICAL EXAMINATION  ECOG PERFORMANCE STATUS: 1 - Symptomatic but completely ambulatory  Filed Vitals:   08/13/14 1400  BP: 127/98  Pulse: 112  Temp: 97.8 F (  36.6 C)  Resp: 18    GENERAL:alert, no distress, well nourished, well developed, comfortable, cooperative and smiling SKIN: skin color,  texture, turgor are normal, no rashes or significant lesions HEAD: Normocephalic, No masses, lesions, tenderness or abnormalities EYES: closing one eye during discussion and drifting of right eye laterally. EARS: External ears normal OROPHARYNX:lips, buccal mucosa, and tongue normal and mucous membranes are moist  NECK: supple, trachea midline LYMPH:  not examined BREAST:not examined LUNGS: clear to auscultation and percussion HEART: regular rate & rhythm ABDOMEN:abdomen soft and normal bowel sounds BACK: Back symmetric, no curvature. EXTREMITIES:less then 2 second capillary refill, no joint deformities, effusion, or inflammation, no skin discoloration  NEURO: alert & oriented x 3 with fluent speech, no focal motor/sensory deficits, gait normal   LABORATORY DATA: CBC    Component Value Date/Time   WBC 11.3* 08/11/2014 0540   RBC 4.95 08/11/2014 0540   HGB 16.3 08/11/2014 0540   HCT 45.5 08/11/2014 0540   PLT 299 08/11/2014 0540   MCV 91.9 08/11/2014 0540   MCH 32.9 08/11/2014 0540   MCHC 35.8 08/11/2014 0540   RDW 12.7 08/11/2014 0540   LYMPHSABS 1.7 08/11/2014 0540   MONOABS 1.3* 08/11/2014 0540   EOSABS 0.5 08/11/2014 0540   BASOSABS 0.0 08/11/2014 0540      Chemistry      Component Value Date/Time   NA 137 08/11/2014 0540   K 3.9 08/11/2014 0540   CL 101 08/11/2014 0540   CO2 22 08/11/2014 0540   BUN 18 08/11/2014 0540   CREATININE 0.77 08/11/2014 0540      Component Value Date/Time   CALCIUM 9.6 08/11/2014 0540   ALKPHOS 77 08/07/2014 0501   AST 14 08/07/2014 0501   ALT 9 08/07/2014 0501   BILITOT 0.6 08/07/2014 0501       RADIOGRAPHIC STUDIES:  Dg Chest 1 View  08/09/2014   CLINICAL DATA:  Status post left-sided thoracentesis  EXAM: CHEST  1 VIEW  COMPARISON:  Chest radiograph and chest CT August 06, 2014  FINDINGS: Most of the left effusion has been removed by thoracentesis. No pneumothorax. A small left effusion remains with patchy atelectasis in the  left mid and lower lung zones. There is moderate consolidation in the medial left base. There is minimal right base atelectasis. Lungs are otherwise clear. Heart size and pulmonary vascularity are within normal limits. There is apparent left hilar adenopathy, appreciated on recent CT.  IMPRESSION: No pneumothorax following thoracentesis. Medial left base consolidation with small left effusion and patchy left mid lower lung zone atelectasis. Minimal right base atelectasis. Left hilar adenopathy.   Electronically Signed   By: Lowella Grip III M.D.   On: 08/09/2014 15:13   Dg Chest 2 View  08/11/2014   CLINICAL DATA:  Acute onset of right shoulder pain and weakness. Initial encounter.  EXAM: CHEST  2 VIEW  COMPARISON:  Chest radiograph performed 08/09/2014, and CT of the chest performed 08/06/2014  FINDINGS: The lungs are well-aerated. An increasing small left pleural effusion is noted, with patchy left basilar airspace opacity. This may reflect postobstructive pneumonia, superimposed on the patient's known left hilar lung mass. The right lung appears clear.  The heart is normal in size. No acute osseous abnormalities are seen.  IMPRESSION: Increasing small left pleural effusion, with patchy left basilar airspace opacity. This may reflect postobstructive pneumonia, superimposed on the patient's known left hilar lung mass.   Electronically Signed   By: Garald Balding M.D.   On:  08/11/2014 06:13   Dg Chest 2 View  08/06/2014   CLINICAL DATA:  Tachycardia. Blurred vision in the left on a for 3 weeks. Pain in weakness in the right upper extremity and right-sided chest today. History of smoking.  EXAM: CHEST  2 VIEW  COMPARISON:  None.  FINDINGS: The heart is enlarged. There is significant opacity in the left hemithorax which consists of pleural effusion and atelectasis or consolidation. The right lung is clear. No pulmonary edema.  IMPRESSION: 1. Cardiomegaly without edema. 2. Significant left lung opacity  consistent with atelectasis or infiltrate and pleural effusion.   Electronically Signed   By: Elizabeth  Brown M.D.   On: 08/06/2014 16:41   Dg Shoulder Right  08/11/2014   CLINICAL DATA:  Acute onset of right shoulder pain and weakness. Initial encounter.  EXAM: RIGHT SHOULDER - 2+ VIEW  COMPARISON:  Right shoulder radiographs performed 06/14/2014  FINDINGS: There is no evidence of fracture or dislocation. The right humeral head is seated within the glenoid fossa. The acromioclavicular joint is unremarkable in appearance. No significant soft tissue abnormalities are seen. The visualized portions of the right lung are clear.  IMPRESSION: No evidence of fracture or dislocation.   Electronically Signed   By: Jeffery  Chang M.D.   On: 08/11/2014 06:11   Ct Chest W Contrast  08/06/2014   CLINICAL DATA:  Hypotension and tachycardia. Left pleural effusion. Smoker  EXAM: CT CHEST WITH CONTRAST  TECHNIQUE: Multidetector CT imaging of the chest was performed during intravenous contrast administration.  CONTRAST:  80mL OMNIPAQUE IOHEXOL 300 MG/ML  SOLN  COMPARISON:  Chest x-ray today  FINDINGS: Image quality degraded by motion.  Large left pleural effusion with compressive atelectasis of the left lower lobe. Left upper lobe is partially expanded. There is a probable mass lesion in the left hilum measuring 28 mm with probable adjacent left hilar adenopathy. Findings are suspicious for carcinoma of the lung. 12 mm right paratracheal lymph node. 12 mm lymph node anterior to the left bronchus. No subcarinal enlarged lymph nodes.  Right lung is clear.  No lung nodule is identified.  Bony destructive lesions compatible with metastatic disease. There is a lesion on the right involving T3. Lesion in the left L1 vertebral body and pedicle extending into the lamina. Left-sided rib lesions.  Right adrenal enlargement measuring 26 mm. This has low density and most likely is adrenal adenoma rather than metastatic disease.  12 mm  right thyroid lesion is indeterminate.  IMPRESSION: Large left pleural effusion with compressive atelectasis left lower lobe. There is a probable mass the left hilum compatible with carcinoma of the lung. PET CT suggested for staging. Bony metastatic disease is noted.  Right adrenal lesion most likely a benign adenoma rather than metastatic disease.  12 mm right thyroid lesion.  Thyroid ultrasound recommended.   Electronically Signed   By: Charles  Clark M.D.   On: 08/06/2014 18:59   Ct Angio Chest Pe W/cm &/or Wo Cm  08/11/2014   CLINICAL DATA:  Acute onset of shortness breath and right-sided back pain. Initial encounter.  EXAM: CT ANGIOGRAPHY CHEST WITH CONTRAST  TECHNIQUE: Multidetector CT imaging of the chest was performed using the standard protocol during bolus administration of intravenous contrast. Multiplanar CT image reconstructions and MIPs were obtained to evaluate the vascular anatomy.  CONTRAST:  100mL OMNIPAQUE IOHEXOL 350 MG/ML SOLN  COMPARISON:  Chest radiograph performed earlier today at 5:48 a.m., and CT of the chest performed 08/06/2014  FINDINGS: There is no   evidence of pulmonary embolus.  A small to moderate left-sided pleural effusion is noted, with partial consolidation of much of the left lower lobe. This raises concern for postobstructive pneumonia, given the masses at the left hilum. The largest mass measures approximately 3.6 x 3.1 cm, while a smaller 2.6 cm mass is seen more inferiorly at the left hilum.  There is mass effect on the pulmonary artery to the left lower lobe and its branches, which passes between the masses. Vague fluid attenuation is seen tracking along the left side of the mediastinum, concerning for a malignant pericardial effusion given its focal appearance. Underlying soft tissue inflammation is seen.  There is no evidence of pneumothorax. The right lung appears clear. A 1.4 cm right-sided peribronchial node is seen, also concerning for metastatic disease.   Aortopulmonary window nodes measure up to 1.4 cm in short axis. This is concerning for metastatic disease. The great vessels are grossly unremarkable in appearance. Incidental note is made of a direct origin of the left vertebral artery from the aortic arch. No axillary lymphadenopathy is seen. The visualized portions of the thyroid gland are unremarkable in appearance.  The visualized portions of the liver and spleen are unremarkable.  No acute osseous abnormalities are seen. A prominent 2.0 cm lytic mass is noted within the left side of vertebral body T3, without definite evidence of mass effect on the spinal canal at this time.  Review of the MIP images confirms the above findings.  IMPRESSION: 1. No evidence of pulmonary embolus. 2. Small to moderate left-sided pleural effusion noted, with partial consolidation of much of the left lower lobe. This raises concern for postobstructive pneumonia, given the masses at the left hilum. The largest mass measures 3.6 cm 3.6 x 2.1 cm, while a smaller 2.6 cm mass is seen more inferiorly at the left hilum. 3. Mass-effect on the pulmonary artery to the left lower lobe and its branches, which passes between the masses. Vague fluid attenuation tracking along the left-sided mediastinum raises concern for a malignant pericardial effusion, given its focal appearance. Underlying soft tissue inflammation within the mediastinum. 4. 1.4 cm right-sided peribronchial node ulcer raises concern for metastatic disease. Aortopulmonary window nodes measure up to 1.4 cm in short axis. 5. Prominent 2.0 cm lytic mass noted within the left side of vertebral body T3, without definite evidence of mass-effect on the spinal canal this time. This is compatible with bony metastatic disease.   Electronically Signed   By: Garald Balding M.D.   On: 08/11/2014 06:55   US Thoracentesis Asp Pleural Space W/img Guide  08/09/2014   CLINICAL DATA:  Left pleural effusion  EXAM: ULTRASOUND GUIDED LEFT  THORACENTESIS  COMPARISON:  Plain film and CT 08/06/2014  PROCEDURE: An ultrasound guided thoracentesis was thoroughly discussed with the patient and questions answered. The benefits, risks, alternatives and complications were also discussed. The patient understands and wishes to proceed with the procedure. Written consent was obtained.  Ultrasound was performed to localize and mark an adequate pocket of fluid in the left posterior chest. The area was then prepped and draped in the normal sterile fashion. 1% Lidocaine was used for local anesthesia. Under ultrasound guidance a 19 gauge Yueh catheter was introduced. Thoracentesis was performed. The catheter was removed and a dressing applied.  Complications:  None.  FINDINGS: A total of approximately 1.85 L of amber fluid was removed. A fluid sample wassent for laboratory analysis.  IMPRESSION: Successful ultrasound guided left thoracentesis yielding 1.85 L of pleural fluid.  Electronically Signed   By: Rolm Baptise M.D.   On: 08/09/2014 15:13      ASSESSMENT AND PLAN:  Lung mass S/P thoracentesis confirming adenocarcinoma without enough tissue for EGFR/ALK testing.  Pathologist will send tissue for testing anyway, but there is not likely enough for testing.  Suspect lung primary but not definitively staining as lung primary (likely irrelevant given clinical picture).  MRI brain due to neurologic issues and suspect metastatic disease.  PET scan for complete staging.  Referral to CTS for more tissue sampling (ALK, EGFR, and/or [facility] testing) for treatment guidance and port placement.  Return in 2-3 weeks for follow-up.  We will work him in sooner when more data is collected.    THERAPY PLAN:  Further work-up for planning of treatment.  All questions were answered. The patient knows to call the clinic with any problems, questions or concerns. We can certainly see the patient much sooner if necessary.  Patient and plan discussed with Dr.  Ancil Linsey and she is in agreement with the aforementioned.   This note is electronically signed by: Robynn Pane 08/13/2014 3:49 PM

## 2014-08-13 NOTE — Patient Instructions (Signed)
[place] at [facility] Discharge Instructions  RECOMMENDATIONS MADE BY THE CONSULTANT AND ANY TEST RESULTS WILL BE SENT TO YOUR REFERRING PHYSICIAN.  Exam and discussion by Robynn Pane, PA-C Need to get some additional xrays to see what all is going on. MRI of Brain to be done at [place] [facility] - Nothing to eat or drink 6 hours prior to the scan. Nothing with sugar in it. Referral to Cardiovascular surgeon for biopsy and port a cath placement.  Follow-up: Office visit in 3 weeks, maybe sooner based upon test results.   Thank you for choosing [place] at [facility] to provide your oncology and hematology care.  To afford each patient quality time with our provider, please arrive at least 15 minutes before your scheduled appointment time.    You need to re-schedule your appointment should you arrive 10 or more minutes late.  We strive to give you quality time with our providers, and arriving late affects you and other patients whose appointments are after yours.  Also, if you no show three or more times for appointments you may be dismissed from the clinic at the providers discretion.     Again, thank you for choosing [facility].  Our hope is that these requests will decrease the amount of time that you wait before being seen by our physicians.       _____________________________________________________________  Should you have questions after your visit to [facility], please contact our office at [phone] between the hours of 8:30 a.m. and 4:30 p.m.  Voicemails left after 4:30 p.m. will not be returned until the following business day.  For prescription refill requests, have your pharmacy contact our office.

## 2014-08-14 ENCOUNTER — Emergency Department (HOSPITAL_COMMUNITY)
Admission: EM | Admit: 2014-08-14 | Discharge: 2014-08-14 | Disposition: A | Discharge status: Home / Self Care | Payer: Medicaid Other | Attending: Emergency Medicine | Admitting: Emergency Medicine

## 2014-08-14 ENCOUNTER — Encounter (HOSPITAL_COMMUNITY): Payer: Self-pay | Admitting: Emergency Medicine

## 2014-08-14 ENCOUNTER — Other Ambulatory Visit: Payer: Self-pay

## 2014-08-14 ENCOUNTER — Emergency Department (HOSPITAL_COMMUNITY): Payer: Medicaid Other

## 2014-08-14 DIAGNOSIS — R0602 Shortness of breath: Secondary | ICD-10-CM | POA: Insufficient documentation

## 2014-08-14 DIAGNOSIS — Z792 Long term (current) use of antibiotics: Secondary | ICD-10-CM | POA: Insufficient documentation

## 2014-08-14 DIAGNOSIS — C7800 Secondary malignant neoplasm of unspecified lung: Secondary | ICD-10-CM | POA: Diagnosis not present

## 2014-08-14 DIAGNOSIS — R630 Anorexia: Secondary | ICD-10-CM | POA: Diagnosis not present

## 2014-08-14 DIAGNOSIS — Z87891 Personal history of nicotine dependence: Secondary | ICD-10-CM | POA: Insufficient documentation

## 2014-08-14 DIAGNOSIS — M25511 Pain in right shoulder: Secondary | ICD-10-CM | POA: Diagnosis not present

## 2014-08-14 DIAGNOSIS — Z8709 Personal history of other diseases of the respiratory system: Secondary | ICD-10-CM | POA: Diagnosis not present

## 2014-08-14 DIAGNOSIS — R5383 Other fatigue: Secondary | ICD-10-CM | POA: Diagnosis not present

## 2014-08-14 DIAGNOSIS — Z79899 Other long term (current) drug therapy: Secondary | ICD-10-CM | POA: Insufficient documentation

## 2014-08-14 DIAGNOSIS — M542 Cervicalgia: Secondary | ICD-10-CM | POA: Insufficient documentation

## 2014-08-14 DIAGNOSIS — R079 Chest pain, unspecified: Secondary | ICD-10-CM | POA: Insufficient documentation

## 2014-08-14 LAB — CBC WITH DIFFERENTIAL/PLATELET
BASOS ABS: 0.1 10*3/uL (ref 0.0–0.1)
Basophils Relative: 1 % (ref 0–1)
EOS PCT: 6 % — AB (ref 0–5)
Eosinophils Absolute: 0.6 10*3/uL (ref 0.0–0.7)
HEMATOCRIT: 45.2 % (ref 39.0–52.0)
HEMOGLOBIN: 15.7 g/dL (ref 13.0–17.0)
LYMPHS ABS: 1.7 10*3/uL (ref 0.7–4.0)
Lymphocytes Relative: 17 % (ref 12–46)
MCH: 32.2 pg (ref 26.0–34.0)
MCHC: 34.7 g/dL (ref 30.0–36.0)
MCV: 92.6 fL (ref 78.0–100.0)
MONO ABS: 0.9 10*3/uL (ref 0.1–1.0)
MONOS PCT: 9 % (ref 3–12)
NEUTROS ABS: 7 10*3/uL (ref 1.7–7.7)
Neutrophils Relative %: 67 % (ref 43–77)
Platelets: 370 10*3/uL (ref 150–400)
RBC: 4.88 MIL/uL (ref 4.22–5.81)
RDW: 12.7 % (ref 11.5–15.5)
WBC: 10.3 10*3/uL (ref 4.0–10.5)

## 2014-08-14 LAB — COMPREHENSIVE METABOLIC PANEL
ALT: 11 U/L (ref 0–53)
AST: 18 U/L (ref 0–37)
Albumin: 3.5 g/dL (ref 3.5–5.2)
Alkaline Phosphatase: 81 U/L (ref 39–117)
Anion gap: 11 (ref 5–15)
BILIRUBIN TOTAL: 0.9 mg/dL (ref 0.3–1.2)
BUN: 14 mg/dL (ref 6–23)
CHLORIDE: 98 mmol/L (ref 96–112)
CO2: 26 mmol/L (ref 19–32)
CREATININE: 0.6 mg/dL (ref 0.50–1.35)
Calcium: 9.4 mg/dL (ref 8.4–10.5)
GFR calc Af Amer: >90 mL/min (ref >90)
GFR calc non Af Amer: >90 mL/min (ref >90)
Glucose, Bld: 98 mg/dL (ref 70–99)
POTASSIUM: 4.1 mmol/L (ref 3.5–5.1)
Sodium: 135 mmol/L (ref 135–145)
Total Protein: 8 g/dL (ref 6.0–8.3)

## 2014-08-14 MED ORDER — ONDANSETRON HCL 4 MG/2ML IJ SOLN
4.0000 mg | Freq: Once | INTRAMUSCULAR | Status: DC
Start: 2014-08-14 — End: 2014-08-14
  Filled 2014-08-14: qty 2

## 2014-08-14 MED ORDER — LORAZEPAM 1 MG PO TABS: 1.0000 mg | ORAL_TABLET | Freq: Three times a day (TID) | ORAL | Status: DC | PRN

## 2014-08-14 MED ORDER — SODIUM CHLORIDE 0.9 % IV BOLUS (SEPSIS)
1000.0000 mL | Freq: Once | INTRAVENOUS | Status: AC
Start: 2014-08-14 — End: 2014-08-14
  Administered 2014-08-14: 1000 mL via INTRAVENOUS

## 2014-08-14 MED ORDER — ONDANSETRON HCL 4 MG/2ML IJ SOLN
4.0000 mg | Freq: Once | INTRAMUSCULAR | Status: AC
  Administered 2014-08-14: 4 mg via INTRAVENOUS

## 2014-08-14 MED ORDER — OXYCODONE HCL 5 MG PO CAPS: ORAL_CAPSULE | ORAL | Status: DC

## 2014-08-14 MED ORDER — IOHEXOL 300 MG/ML  SOLN
80.0000 mL | Freq: Once | INTRAMUSCULAR | Status: AC | PRN
  Administered 2014-08-14: 80 mL via INTRAVENOUS

## 2014-08-14 MED ORDER — FENTANYL CITRATE (PF) 100 MCG/2ML IJ SOLN
100.0000 ug | Freq: Once | INTRAMUSCULAR | Status: AC
  Administered 2014-08-14: 100 ug via INTRAVENOUS
  Filled 2014-08-14: qty 2

## 2014-08-14 MED ORDER — HYDROMORPHONE HCL 1 MG/ML IJ SOLN
1.0000 mg | Freq: Once | INTRAMUSCULAR | Status: AC
  Administered 2014-08-14: 1 mg via INTRAVENOUS
  Filled 2014-08-14: qty 1

## 2014-08-14 NOTE — ED Notes (Signed)
Pt attempting to find a ride home,

## 2014-08-14 NOTE — ED Provider Notes (Signed)
CSN: [id number]     Arrival date & time 08/14/14  7824 History   First MD Initiated Contact with Patient 08/14/14 [phone]     Chief Complaint  Patient presents with  . Neck Pain     (Consider location/radiation/quality/duration/timing/severity/associated sxs/prior Treatment) HPI Comments: Pt is an 49 y/o male who present to the ED with c/o neck pain. Pt c/o right side neck pain to radiates to the right arm . There is left chest wall tenderness with mild SOB. Pt is right hand dominate. Pt has a mass in the lungs and had a pleural effusion on the left side that was drained on yesterday. No blood in sputum. No high fever reported. He presents to the ED for evaluation of right neck and shoulder pain.    The history is provided by the patient.    History reviewed. No pertinent past medical history. Past Surgical History  Procedure Laterality Date  . Appendectomy     Family History  Problem Relation Age of Onset  . Diabetes Mother    History  Substance Use Topics  . Smoking status: Former Smoker -- 0.50 packs/day for 20 years    Types: Cigarettes    Quit date: 04/16/2014  . Smokeless tobacco: Never Used  . Alcohol Use: No     Comment: occ    Review of Systems  Constitutional: Positive for appetite change and fatigue.  Respiratory: Positive for shortness of breath.   Cardiovascular: Positive for chest pain. Negative for palpitations and leg swelling.  Musculoskeletal: Positive for arthralgias and neck pain.  All other systems reviewed and are negative.     Allergies  Review of patient's allergies indicates no known allergies.  Home Medications   Prior to Admission medications   Medication Sig Start Date End Date Taking? Authorizing Provider  Aspirin-Salicylamide-Caffeine (BC HEADACHE POWDER PO) Take 1 packet by mouth as needed (pain).    Yes Historical Provider, MD  hydrocortisone cream 1 % Apply topically 2 (two) times daily as needed for itching. 08/10/14  Yes Maryann  Mikhail, DO  levofloxacin (LEVAQUIN) 500 MG tablet Take 1 tablet (500 mg total) by mouth daily. 08/11/14  Yes Jola Schmidt, MD  naproxen sodium (ANAPROX) 220 MG tablet Take 220 mg by mouth daily as needed (pain).   Yes Historical Provider, MD  hydrochlorothiazide (MICROZIDE) 12.5 MG capsule Take 1 capsule (12.5 mg total) by mouth daily. Patient not taking: Reported on 08/06/2014 06/14/14   Lily Kocher, PA-C  HYDROcodone-acetaminophen (NORCO/VICODIN) 5-325 MG per tablet Take 1 tablet by mouth every 4 (four) hours as needed for moderate pain. Patient not taking: Reported on 08/14/2014 08/13/14   Baird Cancer, PA-C  prochlorperazine (COMPAZINE) 10 MG tablet Take 1 tablet (10 mg total) by mouth every 6 (six) hours as needed for nausea or vomiting. Patient not taking: Reported on 08/14/2014 08/13/14   Manon Hilding Kefalas, PA-C   BP 118/95 mmHg  Pulse 93  Temp(Src) 97.6 F (36.4 C) (Oral)  Resp 24  Ht '5\' 9"'$  (1.753 m)  Wt 145 lb (65.772 kg)  BMI 21.40 kg/m2  SpO2 93% Physical Exam  Neck: Trachea normal. No JVD present. Muscular tenderness present. Carotid bruit is not present. No rigidity. Decreased range of motion present. No edema and no erythema present.  Musculoskeletal:       Right shoulder: He exhibits decreased range of motion, tenderness and pain. He exhibits no effusion and no deformity.    ED Course  Procedures (including critical care time) Labs Review  Labs Reviewed  CBC WITH DIFFERENTIAL/PLATELET - Abnormal; Notable for the following:    Eosinophils Relative 6 (*)    All other components within normal limits  COMPREHENSIVE METABOLIC PANEL    Imaging Review Ct Soft Tissue Neck W Contrast  08/14/2014   CLINICAL DATA:  Right-sided neck pain radiating to the right arm. New diagnosis metastatic lung cancer.  EXAM: CT NECK WITH CONTRAST  TECHNIQUE: Multidetector CT imaging of the neck was performed using the standard protocol following the bolus administration of intravenous contrast.   CONTRAST:  8m OMNIPAQUE IOHEXOL 300 MG/ML  SOLN  COMPARISON:  CT chest 08/11/2014  FINDINGS: Pharynx and larynx: No mucosal or submucosal lesion.  Salivary glands: Parotid and submandibular glands are normal.  Thyroid: Normal  Lymph nodes: No enlarged or low-density nodes in the neck or supraclavicular region. Superior mediastinal disease as shown on previous chest CT.  Vascular: Arterial and venous structures are patent.  Limited intracranial: Negative  Visualized orbits: Normal  Mastoids and visualized paranasal sinuses: Clear  Skeleton: Lytic metastasis to the T3 vertebral body again demonstrated. The degree of involvement of the spinal canal is not clearly shown by CT. No metastatic lesions seen in the cervical spine or the skullbase region.  Upper chest: See results of chest CT.  IMPRESSION: No finding in the neck to explain right-sided neck pain or right arm symptoms. No evidence of mass or lymphadenopathy in the neck.  Metastatic lung cancer as previously shown with extensive involvement in the mediastinum and a lytic metastatic lesion to the left side of the T3 vertebral body. The extent of spinal canal involvement is not precisely shown by CT.   Electronically Signed   By: MNelson ChimesM.D.   On: 08/14/2014 09:44     EKG Interpretation None      MDM  Due to mass in chest that is being considered for metastatic cancer, a CT neck with contrast was ordered. CT reveals evidence of metastice lung cancer, but not findings to suggest reason for neck/shoulder pain. No gross neuro-vascular deficit. Labs non-acute. EKG non-acute. Plan -  Exam findings and CT discussed with patient. Pt treated in the ED for pain. Rx for percocet given to the patient. Pt will stop Norco. Pt to follow up with cancer MD on Monday.    Final diagnoses:  None    *I have reviewed nursing notes, vital signs, and all appropriate lab and imaging results for this patient.*Lily Kocher PA-C 08/15/14 2316  AVarney Biles MD 08/17/14 [phone]

## 2014-08-14 NOTE — ED Notes (Signed)
Patient c/o right side neck pain that radiates into right arm. Denies any known injuries. Patient also c/o pain under left breast with slight short of breath. Per patient had fluid removed from lungs two days ago in ER.

## 2014-08-14 NOTE — Discharge Instructions (Signed)
The CT scan of your neck is negative for any hidden fracture or mass at this time. Please see your specialist as scheduled for additional and more specific testing. Please stop the Norco. Please use Roxicodone every 4-6 hours as needed for pain. This medication may cause drowsiness, and/or constipation. Please use his medication with caution.

## 2014-08-16 ENCOUNTER — Other Ambulatory Visit (HOSPITAL_COMMUNITY)
Admission: RE | Admit: 2014-08-16 | Discharge: 2014-08-16 | Disposition: A | Discharge status: Home / Self Care | Payer: MEDICAID | Source: Ambulatory Visit | Attending: Hematology & Oncology | Admitting: Hematology & Oncology

## 2014-08-16 DIAGNOSIS — C7951 Secondary malignant neoplasm of bone: Secondary | ICD-10-CM | POA: Insufficient documentation

## 2014-08-16 DIAGNOSIS — C3490 Malignant neoplasm of unspecified part of unspecified bronchus or lung: Secondary | ICD-10-CM | POA: Insufficient documentation

## 2014-08-17 ENCOUNTER — Emergency Department (HOSPITAL_COMMUNITY): Payer: Medicaid Other

## 2014-08-17 ENCOUNTER — Inpatient Hospital Stay (HOSPITAL_COMMUNITY)
Admission: EM | Admit: 2014-08-17 | Discharge: 2014-08-18 | DRG: 542 | Disposition: A | Discharge status: Home / Self Care | Payer: Medicaid Other | Attending: Internal Medicine | Admitting: Internal Medicine

## 2014-08-17 ENCOUNTER — Encounter (HOSPITAL_COMMUNITY): Payer: Self-pay | Admitting: Emergency Medicine

## 2014-08-17 DIAGNOSIS — C7989 Secondary malignant neoplasm of other specified sites: Secondary | ICD-10-CM | POA: Diagnosis not present

## 2014-08-17 DIAGNOSIS — M545 Low back pain, unspecified: Secondary | ICD-10-CM | POA: Diagnosis present

## 2014-08-17 DIAGNOSIS — R937 Abnormal findings on diagnostic imaging of other parts of musculoskeletal system: Secondary | ICD-10-CM

## 2014-08-17 DIAGNOSIS — R0602 Shortness of breath: Secondary | ICD-10-CM | POA: Diagnosis not present

## 2014-08-17 DIAGNOSIS — Z23 Encounter for immunization: Secondary | ICD-10-CM

## 2014-08-17 DIAGNOSIS — Z87891 Personal history of nicotine dependence: Secondary | ICD-10-CM

## 2014-08-17 DIAGNOSIS — Z833 Family history of diabetes mellitus: Secondary | ICD-10-CM | POA: Diagnosis not present

## 2014-08-17 DIAGNOSIS — C7970 Secondary malignant neoplasm of unspecified adrenal gland: Secondary | ICD-10-CM | POA: Diagnosis not present

## 2014-08-17 DIAGNOSIS — G936 Cerebral edema: Secondary | ICD-10-CM | POA: Diagnosis present

## 2014-08-17 DIAGNOSIS — R52 Pain, unspecified: Secondary | ICD-10-CM

## 2014-08-17 DIAGNOSIS — R188 Other ascites: Secondary | ICD-10-CM | POA: Diagnosis present

## 2014-08-17 DIAGNOSIS — C801 Malignant (primary) neoplasm, unspecified: Secondary | ICD-10-CM

## 2014-08-17 DIAGNOSIS — I10 Essential (primary) hypertension: Secondary | ICD-10-CM | POA: Diagnosis not present

## 2014-08-17 DIAGNOSIS — C3490 Malignant neoplasm of unspecified part of unspecified bronchus or lung: Secondary | ICD-10-CM | POA: Diagnosis present

## 2014-08-17 DIAGNOSIS — R109 Unspecified abdominal pain: Secondary | ICD-10-CM

## 2014-08-17 DIAGNOSIS — C7931 Secondary malignant neoplasm of brain: Secondary | ICD-10-CM | POA: Diagnosis present

## 2014-08-17 DIAGNOSIS — J90 Pleural effusion, not elsewhere classified: Secondary | ICD-10-CM | POA: Insufficient documentation

## 2014-08-17 DIAGNOSIS — K5900 Constipation, unspecified: Secondary | ICD-10-CM | POA: Diagnosis present

## 2014-08-17 DIAGNOSIS — J910 Malignant pleural effusion: Secondary | ICD-10-CM | POA: Diagnosis present

## 2014-08-17 DIAGNOSIS — M25511 Pain in right shoulder: Secondary | ICD-10-CM

## 2014-08-17 DIAGNOSIS — M79601 Pain in right arm: Secondary | ICD-10-CM | POA: Diagnosis present

## 2014-08-17 DIAGNOSIS — R0600 Dyspnea, unspecified: Secondary | ICD-10-CM

## 2014-08-17 DIAGNOSIS — E279 Disorder of adrenal gland, unspecified: Secondary | ICD-10-CM | POA: Diagnosis present

## 2014-08-17 DIAGNOSIS — C7951 Secondary malignant neoplasm of bone: Principal | ICD-10-CM | POA: Diagnosis present

## 2014-08-17 DIAGNOSIS — C799 Secondary malignant neoplasm of unspecified site: Secondary | ICD-10-CM | POA: Diagnosis not present

## 2014-08-17 HISTORY — DX: Malignant neoplasm of unspecified part of unspecified bronchus or lung: C34.90

## 2014-08-17 LAB — CBC WITH DIFFERENTIAL/PLATELET
BASOS ABS: 0.1 10*3/uL (ref 0.0–0.1)
Basophils Relative: 1 % (ref 0–1)
EOS ABS: 0.5 10*3/uL (ref 0.0–0.7)
Eosinophils Relative: 5 % (ref 0–5)
HCT: 42.4 % (ref 39.0–52.0)
Hemoglobin: 14.6 g/dL (ref 13.0–17.0)
LYMPHS PCT: 13 % (ref 12–46)
Lymphs Abs: 1.5 10*3/uL (ref 0.7–4.0)
MCH: 32.2 pg (ref 26.0–34.0)
MCHC: 34.4 g/dL (ref 30.0–36.0)
MCV: 93.4 fL (ref 78.0–100.0)
Monocytes Absolute: 1 10*3/uL (ref 0.1–1.0)
Monocytes Relative: 9 % (ref 3–12)
Neutro Abs: 8 10*3/uL — ABNORMAL HIGH (ref 1.7–7.7)
Neutrophils Relative %: 72 % (ref 43–77)
PLATELETS: 353 10*3/uL (ref 150–400)
RBC: 4.54 MIL/uL (ref 4.22–5.81)
RDW: 12.8 % (ref 11.5–15.5)
WBC: 11 10*3/uL — ABNORMAL HIGH (ref 4.0–10.5)

## 2014-08-17 LAB — LIPASE, BLOOD: LIPASE: 16 U/L — AB (ref 22–51)

## 2014-08-17 LAB — COMPREHENSIVE METABOLIC PANEL
ALK PHOS: 72 U/L (ref 38–126)
ALT: 9 U/L — ABNORMAL LOW (ref 17–63)
AST: 13 U/L — ABNORMAL LOW (ref 15–41)
Albumin: 3.2 g/dL — ABNORMAL LOW (ref 3.5–5.0)
Anion gap: 12 (ref 5–15)
BUN: 16 mg/dL (ref 6–20)
CO2: 25 mmol/L (ref 22–32)
Calcium: 9 mg/dL (ref 8.9–10.3)
Chloride: 101 mmol/L (ref 101–111)
Creatinine, Ser: 0.75 mg/dL (ref 0.61–1.24)
GFR calc non Af Amer: >60 mL/min (ref >60)
Glucose, Bld: 88 mg/dL (ref 70–99)
Potassium: 4.1 mmol/L (ref 3.5–5.1)
SODIUM: 138 mmol/L (ref 135–145)
TOTAL PROTEIN: 7.6 g/dL (ref 6.5–8.1)
Total Bilirubin: 0.9 mg/dL (ref 0.3–1.2)

## 2014-08-17 LAB — TROPONIN I: Troponin I: <0.03 ng/mL (ref <0.031)

## 2014-08-17 MED ORDER — HYDROCORTISONE 1 % EX CREA: TOPICAL_CREAM | Freq: Two times a day (BID) | CUTANEOUS | Status: DC | PRN

## 2014-08-17 MED ORDER — ACETAMINOPHEN 325 MG PO TABS: 650.0000 mg | ORAL_TABLET | Freq: Four times a day (QID) | ORAL | Status: DC | PRN

## 2014-08-17 MED ORDER — ONDANSETRON HCL 4 MG PO TABS: 4.0000 mg | ORAL_TABLET | Freq: Four times a day (QID) | ORAL | Status: DC | PRN

## 2014-08-17 MED ORDER — HEPARIN SODIUM (PORCINE) 5000 UNIT/ML IJ SOLN
5000.0000 [IU] | Freq: Three times a day (TID) | INTRAMUSCULAR | Status: DC
  Administered 2014-08-17 – 2014-08-18 (×4): 5000 [IU] via SUBCUTANEOUS
  Filled 2014-08-17 (×4): qty 1

## 2014-08-17 MED ORDER — IBUPROFEN 400 MG PO TABS
400.0000 mg | ORAL_TABLET | Freq: Three times a day (TID) | ORAL | Status: DC
  Administered 2014-08-17 – 2014-08-18 (×4): 400 mg via ORAL
  Filled 2014-08-17 (×4): qty 1

## 2014-08-17 MED ORDER — DEXAMETHASONE SODIUM PHOSPHATE 4 MG/ML IJ SOLN
8.0000 mg | Freq: Three times a day (TID) | INTRAMUSCULAR | Status: DC
  Administered 2014-08-17 – 2014-08-18 (×4): 8 mg via INTRAVENOUS
  Filled 2014-08-17 (×4): qty 2

## 2014-08-17 MED ORDER — POLYETHYLENE GLYCOL 3350 17 G PO PACK
17.0000 g | PACK | Freq: Two times a day (BID) | ORAL | Status: DC
  Administered 2014-08-17 – 2014-08-18 (×3): 17 g via ORAL
  Filled 2014-08-17 (×3): qty 1

## 2014-08-17 MED ORDER — ONDANSETRON HCL 4 MG/2ML IJ SOLN
4.0000 mg | Freq: Once | INTRAMUSCULAR | Status: AC
Start: 2014-08-17 — End: 2014-08-17
  Administered 2014-08-17: 4 mg via INTRAVENOUS
  Filled 2014-08-17: qty 2

## 2014-08-17 MED ORDER — ONDANSETRON HCL 4 MG/2ML IJ SOLN: 4.0000 mg | Freq: Four times a day (QID) | INTRAMUSCULAR | Status: DC | PRN

## 2014-08-17 MED ORDER — SODIUM CHLORIDE 0.9 % IV BOLUS (SEPSIS)
1000.0000 mL | Freq: Once | INTRAVENOUS | Status: AC
  Administered 2014-08-17: 1000 mL via INTRAVENOUS

## 2014-08-17 MED ORDER — IOHEXOL 300 MG/ML  SOLN
100.0000 mL | Freq: Once | INTRAMUSCULAR | Status: AC | PRN
  Administered 2014-08-17: 100 mL via INTRAVENOUS

## 2014-08-17 MED ORDER — MORPHINE SULFATE 4 MG/ML IJ SOLN
4.0000 mg | Freq: Once | INTRAMUSCULAR | Status: AC
  Administered 2014-08-17: 4 mg via INTRAVENOUS
  Filled 2014-08-17: qty 1

## 2014-08-17 MED ORDER — GADOBENATE DIMEGLUMINE 529 MG/ML IV SOLN
15.0000 mL | Freq: Once | INTRAVENOUS | Status: AC | PRN
  Administered 2014-08-17: 15 mL via INTRAVENOUS

## 2014-08-17 MED ORDER — PAMIDRONATE DISODIUM 30 MG IV SOLR
INTRAVENOUS | Status: AC
  Filled 2014-08-17: qty 30

## 2014-08-17 MED ORDER — ACETAMINOPHEN 650 MG RE SUPP: 650.0000 mg | Freq: Four times a day (QID) | RECTAL | Status: DC | PRN

## 2014-08-17 MED ORDER — IOHEXOL 300 MG/ML  SOLN
50.0000 mL | Freq: Once | INTRAMUSCULAR | Status: AC | PRN
  Administered 2014-08-17: 50 mL via ORAL

## 2014-08-17 MED ORDER — OXYCODONE HCL 5 MG PO TABS
5.0000 mg | ORAL_TABLET | Freq: Three times a day (TID) | ORAL | Status: DC
Start: 2014-08-17 — End: 2014-08-18
  Administered 2014-08-17 – 2014-08-18 (×4): 5 mg via ORAL
  Filled 2014-08-17 (×5): qty 1

## 2014-08-17 MED ORDER — ENSURE ENLIVE PO LIQD
237.0000 mL | Freq: Two times a day (BID) | ORAL | Status: DC
  Administered 2014-08-17 – 2014-08-18 (×2): 237 mL via ORAL

## 2014-08-17 MED ORDER — HYDROMORPHONE HCL 1 MG/ML IJ SOLN
1.0000 mg | INTRAMUSCULAR | Status: DC | PRN
  Administered 2014-08-17 – 2014-08-18 (×4): 1 mg via INTRAVENOUS
  Filled 2014-08-17 (×4): qty 1

## 2014-08-17 MED ORDER — FENTANYL 25 MCG/HR TD PT72
25.0000 ug | MEDICATED_PATCH | TRANSDERMAL | Status: DC
  Administered 2014-08-17: 25 ug via TRANSDERMAL
  Filled 2014-08-17: qty 1

## 2014-08-17 MED ORDER — LORAZEPAM 1 MG PO TABS: 1.0000 mg | ORAL_TABLET | Freq: Three times a day (TID) | ORAL | Status: DC | PRN

## 2014-08-17 MED ORDER — ALBUTEROL SULFATE (2.5 MG/3ML) 0.083% IN NEBU: 2.5000 mg | INHALATION_SOLUTION | RESPIRATORY_TRACT | Status: DC | PRN

## 2014-08-17 MED ORDER — PNEUMOCOCCAL VAC POLYVALENT 25 MCG/0.5ML IJ INJ
0.5000 mL | INJECTION | INTRAMUSCULAR | Status: AC
  Administered 2014-08-18: 0.5 mL via INTRAMUSCULAR
  Filled 2014-08-17: qty 0.5

## 2014-08-17 MED ORDER — BISACODYL 10 MG RE SUPP
10.0000 mg | Freq: Once | RECTAL | Status: AC
  Administered 2014-08-17: 10 mg via RECTAL
  Filled 2014-08-17: qty 1

## 2014-08-17 MED ORDER — OXYCODONE HCL 5 MG PO TABS: 5.0000 mg | ORAL_TABLET | ORAL | Status: DC | PRN

## 2014-08-17 MED ORDER — POTASSIUM CHLORIDE IN NACL 20-0.9 MEQ/L-% IV SOLN
INTRAVENOUS | Status: DC
  Administered 2014-08-17 – 2014-08-18 (×2): via INTRAVENOUS

## 2014-08-17 MED ORDER — ALUM & MAG HYDROXIDE-SIMETH 200-200-20 MG/5ML PO SUSP: 30.0000 mL | Freq: Four times a day (QID) | ORAL | Status: DC | PRN

## 2014-08-17 MED ORDER — SODIUM CHLORIDE 0.9 % IV SOLN
60.0000 mg | Freq: Once | INTRAVENOUS | Status: AC
  Administered 2014-08-17: 60 mg via INTRAVENOUS
  Filled 2014-08-17: qty 20

## 2014-08-17 MED ORDER — FENTANYL CITRATE (PF) 100 MCG/2ML IJ SOLN
50.0000 ug | Freq: Once | INTRAMUSCULAR | Status: AC
  Administered 2014-08-17: 50 ug via INTRAVENOUS
  Filled 2014-08-17: qty 2

## 2014-08-17 NOTE — ED Notes (Signed)
Pt has right arm pain and back pain x 3 weeks and currently short of breath.

## 2014-08-17 NOTE — ED Provider Notes (Signed)
Discussed findings of MRI scan with patient.   Also discussed with Dr. Caryn Section.  She will evaluate patient.  Nat Christen, MD 08/17/14 [phone]

## 2014-08-17 NOTE — ED Provider Notes (Signed)
CSN: [id number]     Arrival date & time 08/17/14  0151 History   First MD Initiated Contact with Patient 08/17/14 0154     No chief complaint on file.    (Consider location/radiation/quality/duration/timing/severity/associated sxs/prior Treatment) HPI  Patient states he is having pain in his right arm for the past 3-4 weeks. He states about 45 minutes ago it got more intense. He states movement makes it feel worse, however some movement makes it feel better. He also reports some abdominal pain tonight and indicates below his umbilicus. He has nausea but denies vomiting or diarrhea. He states his last bowel movement was last week. He also complains of upper abdominal pain that he describes as burning. He states last night he started getting shortness of breath. He denies cough or fever. He denies hemoptysis. He should states he quit smoking about 4 months ago. He states he was recently diagnosed with lung cancer. He states he's not getting chemotherapy or radiation therapy. He states he has a appointment later on today. He states he does not know who he supposed to follow-up with. Patient states he doesn't have any pain medication to take. He states "they only give me a couple of pills".  Oncology Dr Whitney Muse PCP none  History reviewed. No pertinent past medical history. Past Surgical History  Procedure Laterality Date  . Appendectomy     Family History  Problem Relation Age of Onset  . Diabetes Mother    History  Substance Use Topics  . Smoking status: Former Smoker -- 0.50 packs/day for 20 years    Types: Cigarettes    Quit date: 04/16/2014  . Smokeless tobacco: Never Used  . Alcohol Use: No     Comment: occ  just got on disability  Review of Systems  All other systems reviewed and are negative.     Allergies  Review of patient's allergies indicates no known allergies.  Home Medications   Prior to Admission medications   Medication Sig Start Date End Date Taking?  Authorizing Provider  Aspirin-Salicylamide-Caffeine (BC HEADACHE POWDER PO) Take 1 packet by mouth as needed (pain).     Historical Provider, MD  hydrochlorothiazide (MICROZIDE) 12.5 MG capsule Take 1 capsule (12.5 mg total) by mouth daily. Patient not taking: Reported on 08/06/2014 06/14/14   Lily Kocher, PA-C  HYDROcodone-acetaminophen (NORCO/VICODIN) 5-325 MG per tablet Take 1 tablet by mouth every 4 (four) hours as needed for moderate pain. Patient not taking: Reported on 08/14/2014 08/13/14   Baird Cancer, PA-C  hydrocortisone cream 1 % Apply topically 2 (two) times daily as needed for itching. 08/10/14   Maryann Mikhail, DO  levofloxacin (LEVAQUIN) 500 MG tablet Take 1 tablet (500 mg total) by mouth daily. 08/11/14   Jola Schmidt, MD  LORazepam (ATIVAN) 1 MG tablet Take 1 tablet (1 mg total) by mouth 3 (three) times daily as needed for anxiety. 08/14/14   Lily Kocher, PA-C  naproxen sodium (ANAPROX) 220 MG tablet Take 220 mg by mouth daily as needed (pain).    Historical Provider, MD  oxycodone (OXY-IR) 5 MG capsule 1 or 2 q6h prn pain 08/14/14   Lily Kocher, PA-C  prochlorperazine (COMPAZINE) 10 MG tablet Take 1 tablet (10 mg total) by mouth every 6 (six) hours as needed for nausea or vomiting. Patient not taking: Reported on 08/14/2014 08/13/14   Manon Hilding Kefalas, PA-C   BP 124/86 mmHg  Pulse 100  Temp(Src) 97.4 F (36.3 C) (Oral)  Resp 14  Ht '5\' 6"'$  (  1.676 m)  Wt 150 lb (68.04 kg)  BMI 24.22 kg/m2  SpO2 92%  Vital signs normal except for borderline tachycardia  Physical Exam  Constitutional: He is oriented to person, place, and time.  Non-toxic appearance. He does not appear ill. He appears distressed.  Thin black male  HENT:  Head: Normocephalic and atraumatic.  Right Ear: External ear normal.  Left Ear: External ear normal.  Nose: Nose normal. No mucosal edema or rhinorrhea.  Mouth/Throat: Oropharynx is clear and moist and mucous membranes are normal. No dental abscesses  or uvula swelling.  Eyes: Conjunctivae and EOM are normal. Pupils are equal, round, and reactive to light.  Patient's eyes seem to be disconjugate at times and he seems to have trouble focusing.  Neck: Normal range of motion and full passive range of motion without pain. Neck supple.  Cardiovascular: Normal rate, regular rhythm and normal heart sounds.  Exam reveals no gallop and no friction rub.   No murmur heard. Pulmonary/Chest: Effort normal. No respiratory distress. He has no wheezes. He has no rhonchi. He has no rales. He exhibits no tenderness and no crepitus.  Patient unable to sit up to let me listen to his lungs. He was unable to lay on his left side to let me here on his lungs. He had to lay on his right side. Patient has marked diminished breath sounds in his left chest almost all the way up.  Abdominal: Soft. Normal appearance and bowel sounds are normal. He exhibits no distension. There is no tenderness. There is no rebound and no guarding.  Musculoskeletal: Normal range of motion. He exhibits no edema or tenderness.  Moves all extremities well.   Neurological: He is alert and oriented to person, place, and time. He has normal strength. No cranial nerve deficit.  Skin: Skin is warm, dry and intact. No rash noted. No erythema. No pallor.  Psychiatric: He has a normal mood and affect. His speech is normal and behavior is normal. His mood appears not anxious.  Nursing note and vitals reviewed.   ED Course  Procedures (including critical care time)  Medications  sodium chloride 0.9 % bolus 1,000 mL (0 mLs Intravenous Stopped 08/17/14 0516)  fentaNYL (SUBLIMAZE) injection 50 mcg (50 mcg Intravenous Given 08/17/14 0232)  ondansetron (ZOFRAN) injection 4 mg (4 mg Intravenous Given 08/17/14 0233)  iohexol (OMNIPAQUE) 300 MG/ML solution 50 mL (50 mLs Oral Contrast Given 08/17/14 0344)  iohexol (OMNIPAQUE) 300 MG/ML solution 100 mL (100 mLs Intravenous Contrast Given 08/17/14 0344)  morphine 4  MG/ML injection 4 mg (4 mg Intravenous Given 08/17/14 0540)    Patient was rechecked at 5:20 AM. Patient states his right shoulder still hurting. He reports he may have some weakness in his left leg and he has been having some stumbling. He denies any numbness in his legs. He denies any urinary incontinence. He denies rectal incontinence and actually has been having constipation with no BM for a week. I reviewed patient's CT scan and he does have a lot of stool especially in the right colon  Patient was ambulated by nursing staff. They report his pulse ox was 92-93% on room air. His heart rate was 126.  I discussed getting MRI of his lumbar spine this morning and patient is agreeable. Due to his persistent unrelenting pain in his right shoulder with normal x-rays a MRI of his shoulder was also done.  7:40 AM at change of shift patient was left with Dr. Lacinda Axon to get his  MRI results.   Review of patient's chart shows he was just discharged from the hospital on 4/28. He was admitted for a large left-sided pleural effusion and had a thoracentesis done. 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 He was referred to Dr. Whitney Muse who he saw the following day after he was discharged on 4/29. She was going to refer him to the thoracic surgeon to get a tissue diagnosis and to get a Port-A-Cath inserted for future chemotherapy. Patient has been noted to have a metastatic lesion to T3. He is also scheduled to get a MRI and a PET scan.  Review of the [place] shows patient got #60 hydrocodone 5/325 on April 29 from the [facility], he also received #30 oxycodone 5 mg tablets on April 30 from the emergency department. He received #15 hydrocodone 5/325 on April 27 from the ER, and #15 hydrocodone 5/325 on February 29 from the ED.   Labs Review  Results for orders placed or performed during the hospital encounter of 08/17/14  CBC with Differential/Platelet  Result Value Ref Range    WBC 11.0 (H) 4.0 - 10.5 K/uL   RBC 4.54 4.22 - 5.81 MIL/uL   Hemoglobin 14.6 13.0 - 17.0 g/dL   HCT 42.4 39.0 - 52.0 %   MCV 93.4 78.0 - 100.0 fL   MCH 32.2 26.0 - 34.0 pg   MCHC 34.4 30.0 - 36.0 g/dL   RDW 12.8 11.5 - 15.5 %   Platelets 353 150 - 400 K/uL   Neutrophils Relative % 72 43 - 77 %   Neutro Abs 8.0 (H) 1.7 - 7.7 K/uL   Lymphocytes Relative 13 12 - 46 %   Lymphs Abs 1.5 0.7 - 4.0 K/uL   Monocytes Relative 9 3 - 12 %   Monocytes Absolute 1.0 0.1 - 1.0 K/uL   Eosinophils Relative 5 0 - 5 %   Eosinophils Absolute 0.5 0.0 - 0.7 K/uL   Basophils Relative 1 0 - 1 %   Basophils Absolute 0.1 0.0 - 0.1 K/uL  Comprehensive metabolic panel  Result Value Ref Range   Sodium 138 135 - 145 mmol/L   Potassium 4.1 3.5 - 5.1 mmol/L   Chloride 101 101 - 111 mmol/L   CO2 25 22 - 32 mmol/L   Glucose, Bld 88 70 - 99 mg/dL   BUN 16 6 - 20 mg/dL   Creatinine, Ser 0.75 0.61 - 1.24 mg/dL   Calcium 9.0 8.9 - 10.3 mg/dL   Total Protein 7.6 6.5 - 8.1 g/dL   Albumin 3.2 (L) 3.5 - 5.0 g/dL   AST 13 (L) 15 - 41 U/L   ALT 9 (L) 17 - 63 U/L   Alkaline Phosphatase 72 38 - 126 U/L   Total Bilirubin 0.9 0.3 - 1.2 mg/dL   GFR calc non Af Amer >60 >60 mL/min   GFR calc Af Amer >60 >60 mL/min   Anion gap 12 5 - 15  Lipase, blood  Result Value Ref Range   Lipase 16 (L) 22 - 51 U/L  Troponin I  Result Value Ref Range   Troponin I <0.03 <0.031 ng/mL   Laboratory interpretation all normal except leukocytosis   Results for orders placed or performed during the hospital encounter of 08/14/14  Comprehensive metabolic panel  Result Value Ref Range   Sodium 135 135 - 145 mmol/L   Potassium 4.1 3.5 - 5.1 mmol/L   Chloride 98 96 - 112 mmol/L   CO2  26 19 - 32 mmol/L   Glucose, Bld 98 70 - 99 mg/dL   BUN 14 6 - 23 mg/dL   Creatinine, Ser 0.60 0.50 - 1.35 mg/dL   Calcium 9.4 8.4 - 10.5 mg/dL   Total Protein 8.0 6.0 - 8.3 g/dL   Albumin 3.5 3.5 - 5.2 g/dL   AST 18 0 - 37 U/L   ALT 11 0 - 53 U/L    Alkaline Phosphatase 81 39 - 117 U/L   Total Bilirubin 0.9 0.3 - 1.2 mg/dL   GFR calc non Af Amer >90 >90 mL/min   GFR calc Af Amer >90 >90 mL/min   Anion gap 11 5 - 15  CBC with Differential  Result Value Ref Range   WBC 10.3 4.0 - 10.5 K/uL   RBC 4.88 4.22 - 5.81 MIL/uL   Hemoglobin 15.7 13.0 - 17.0 g/dL   HCT 45.2 39.0 - 52.0 %   MCV 92.6 78.0 - 100.0 fL   MCH 32.2 26.0 - 34.0 pg   MCHC 34.7 30.0 - 36.0 g/dL   RDW 12.7 11.5 - 15.5 %   Platelets 370 150 - 400 K/uL   Neutrophils Relative % 67 43 - 77 %   Neutro Abs 7.0 1.7 - 7.7 K/uL   Lymphocytes Relative 17 12 - 46 %   Lymphs Abs 1.7 0.7 - 4.0 K/uL   Monocytes Relative 9 3 - 12 %   Monocytes Absolute 0.9 0.1 - 1.0 K/uL   Eosinophils Relative 6 (H) 0 - 5 %   Eosinophils Absolute 0.6 0.0 - 0.7 K/uL   Basophils Relative 1 0 - 1 %   Basophils Absolute 0.1 0.0 - 0.1 K/uL     Imaging Review Dg Chest 2 View  08/17/2014   CLINICAL DATA:  Right arm pain and dyspnea for 3 weeks.  EXAM: CHEST  2 VIEW  COMPARISON:  08/11/2014  FINDINGS: There is left base consolidation and left pleural effusion, worsened from 08/11/2014. There is left hilar adenopathy, unchanged. The right lung is clear.  IMPRESSION: Enlarging left effusion. Worsening left base consolidation. Known left hilar and mediastinal masses.   Electronically Signed   By: Andreas Newport M.D.   On: 08/17/2014 04:53   Ct Abdomen Pelvis W Contrast  08/17/2014   CLINICAL DATA:  Recently diagnosed lung cancer.  Mid abdominal pain.  EXAM: CT ABDOMEN AND PELVIS WITH CONTRAST  TECHNIQUE: Multidetector CT imaging of the abdomen and pelvis was performed using the standard protocol following bolus administration of intravenous contrast.  CONTRAST:  48m OMNIPAQUE IOHEXOL 300 MG/ML SOLN, 1021mOMNIPAQUE IOHEXOL 300 MG/ML SOLN  COMPARISON:  08/11/2014 the large left effusion and left lower lobe collapse is again noted.  No hepatic metastases are evident. There is a low-attenuation right adrenal  mass measuring 2.4 x 3.5 cm and a low-attenuation left adrenal mass measuring 1.1 x 1.7 cm. These are suspicious for hematogenous metastases. The spleen, pancreas and kidneys appear normal.  There is a small volume peritoneal ascites.  There is no bowel obstruction.  There is no extraluminal air.  There is a lytic lesion involving the right L1 pedicle with pathologic fracture. There is slight soft tissue encroachment on the right lateral aspect of the central canal at L1. There may also be soft tissue extending up into the right T12-L1 neural foramen from the L1 metastasis. There is a lytic lesion involving the left tenth rib posteriorly.  FINDINGS: *Skeletal metastasis to the right L1 pedicle with pathologic fracture  and with slight soft tissue encroachment on the right lateral aspect of the central canal. There may also be soft tissue encroachment on the right T12-L1 foramen. Lumbar MRI recommended for optimal characterization. *Additional skeletal metastasis to the left tenth rib posteriorly. *Bilateral adrenal masses, probably hematogenous metastases *Peritoneal ascites   Electronically Signed   By: Andreas Newport M.D.   On: 08/17/2014 04:12     Dg Chest 1 View  08/09/2014   CLINICAL DATA:  Status post left-sided thoracentesis  T.  IMPRESSION: No pneumothorax following thoracentesis. Medial left base consolidation with small left effusion and patchy left mid lower lung zone atelectasis. Minimal right base atelectasis. Left hilar adenopathy.   Electronically Signed   By: Lowella Grip III M.D.   On: 08/09/2014 15:13   Dg Chest 2 View  08/11/2014   CLINICAL DATA:  Acute onset of right shoulder pain and weakness.  MPRESSION: Increasing small left pleural effusion, with patchy left basilar airspace opacity. This may reflect postobstructive pneumonia, superimposed on the patient's known left hilar lung mass.   Electronically Signed   By: Garald Balding M.D.   On: 08/11/2014 06:13   Dg Chest 2  View  08/06/2014   CLINICAL DATA:  Tachycardia. Blurred vision in the left on a for 3 weeks. Pain in weakness in the right upper extremity and right-sided chest today. History of smoking. Marland Kitchen  IMPRESSION: 1. Cardiomegaly without edema. 2. Significant left lung opacity consistent with atelectasis or infiltrate and pleural effusion.   Electronically Signed   By: Nolon Nations M.D.   On: 08/06/2014 16:41   Dg Shoulder Right  08/11/2014   CLINICAL DATA:  Acute onset of right shoulder pain and weakness. Initial encounter.   IMPRESSION: No evidence of fracture or dislocation.   Electronically Signed   By: Garald Balding M.D.   On: 08/11/2014 06:11   Ct Soft Tissue Neck W Contrast  08/14/2014   CLINICAL DATA:  Right-sided neck pain radiating to the right arm. New diagnosis metastatic lung cancer.  .  IMPRESSION: No finding in the neck to explain right-sided neck pain or right arm symptoms. No evidence of mass or lymphadenopathy in the neck.  Metastatic lung cancer as previously shown with extensive involvement in the mediastinum and a lytic metastatic lesion to the left side of the T3 vertebral body. The extent of spinal canal involvement is not precisely shown by CT.   Electronically Signed   By: Nelson Chimes M.D.   On: 08/14/2014 09:44   Ct Chest W Contrast  08/06/2014   CLINICAL DATA:  Hypotension and tachycardia. Left pleural effusion. Smoker  e.  IMPRESSION: Large left pleural effusion with compressive atelectasis left lower lobe. There is a probable mass the left hilum compatible with carcinoma of the lung. PET CT suggested for staging. Bony metastatic disease is noted.  Right adrenal lesion most likely a benign adenoma rather than metastatic disease.  12 mm right thyroid lesion.  Thyroid ultrasound recommended.   Electronically Signed   By: Franchot Gallo M.D.   On: 08/06/2014 18:59   Ct Angio Chest Pe W/cm &/or Wo Cm  08/11/2014   CLINICAL DATA:  Acute onset of shortness breath and right-sided back  pain. Initial encounter.  Marland Kitchen  IMPRESSION: 1. No evidence of pulmonary embolus. 2. Small to moderate left-sided pleural effusion noted, with partial consolidation of much of the left lower lobe. This raises concern for postobstructive pneumonia, given the masses at the left hilum. The largest mass  measures 3.6 cm 3.6 x 2.1 cm, while a smaller 2.6 cm mass is seen more inferiorly at the left hilum. 3. Mass-effect on the pulmonary artery to the left lower lobe and its branches, which passes between the masses. Vague fluid attenuation tracking along the left-sided mediastinum raises concern for a malignant pericardial effusion, given its focal appearance. Underlying soft tissue inflammation within the mediastinum. 4. 1.4 cm right-sided peribronchial node ulcer raises concern for metastatic disease. Aortopulmonary window nodes measure up to 1.4 cm in short axis. 5. Prominent 2.0 cm lytic mass noted within the left side of vertebral body T3, without definite evidence of mass-effect on the spinal canal this time. This is compatible with bony metastatic disease.   Electronically Signed   By: Garald Balding M.D.   On: 08/11/2014 06:55   US Thoracentesis Asp Pleural Space W/img Guide  08/09/2014   CLINICAL DATA:  Left pleural effusion  FINDINGS: A total of approximately 1.85 L of amber fluid was removed. A fluid sample wassent for laboratory analysis.  IMPRESSION: Successful ultrasound guided left thoracentesis yielding 1.85 L of pleural fluid.   Electronically Signed   By: Rolm Baptise M.D.   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

## 2014-08-17 NOTE — H&P (Signed)
[facility] History and Physical  [name] KYH:[id number] DOB: 1966/02/15 DOA: 08/17/2014  Referring physician: ED physician, Dr. Lacinda Axon PCP: No PCP Per Patient   Chief Complaint: Right arm pain, shortness of breath, abdominal pain, back pain.  HPI: [name] is a 49 y.o. male with a recent diagnosis of metastatic lung cancer. He was just hospitalized from 08/06/2014 through 08/10/2014 for newly diagnosed lung mass causing a large left pleural effusion. The patient had a thoracentesis performed and the fluid was sent for cytology, but the result was pending at the time of discharge. The patient followed up with oncology on 08/13/2014. Per [name] note, the pathology confirmed adenocarcinoma, but there needed to be more tissue sampling for further testing to guide treatment. Over the last few days, the patient has had worsening right arm pain, more proximal than distal; more shortness of breath; and more abdominal pain. The pain in his right arm is such that he has difficulty lifting it. He is right-handed. The shortness of breath is worse with movement or activity, and less so at rest. He has had some pleuritic chest pain in the center and on the left. He complains of diffuse abdominal pain and it has been associated with nausea, but no vomiting, diarrhea, or bloody stools. He says he has not had a bowel movement in 1 or 2 weeks. He denies pain with urination. He also has a headache, occasional dizziness, and blurred vision/decrease in vision of his left eye. He has also had worsening low back pain with the pain radiating to right greater than left legs; associated numbness in both legs; and mild weakness, but he says that he can still ambulate. He has no difficulty with urination, but he is constipated.  In the ED, he was afebrile and hemodynamically stable. CT of abdomen and pelvis revealed skeletal metastasis to the right L1 pedicle with pathologic fracture; bilateral adrenal masses; and  mild peritoneal ascites. Chest x-ray revealed enlarging left pleural effusion worsening left base consolidation and known left hilar and mediastinal masses. MRI of the right shoulder revealed soft tissue mass in the infraspinous muscle belly consistent with metastatic disease. MRI of his lumbar spine revealed L1 metastasis with epidural and right T12-L1 foraminal infiltration; no cord or foraminal nerve compression; and malignant-appearing left pleural effusion with small ascites in right adrenal metastasis. He is being admitted for further evaluation and management.     Review of Systems:  Globally positive as above in history present illness. In addition, he has had weight loss; he denies syncope.  Past Medical History  Diagnosis Date  . Primary lung adenocarcinoma 08/17/2014   Past Surgical History  Procedure Laterality Date  . Appendectomy     Social History: The patient is single. He lives with his mother a refill. He has no children. He stopped smoking tobacco, marijuana months ago. He denies alcohol use. He is unemployed.   No Known Allergies  Family History  Problem Relation Age of Onset  . Diabetes Mother     Prior to Admission medications   Medication Sig Start Date End Date Taking? Authorizing Provider  hydrocortisone cream 1 % Apply topically 2 (two) times daily as needed for itching. 08/10/14  Yes Maryann Mikhail, DO  levofloxacin (LEVAQUIN) 500 MG tablet Take 1 tablet (500 mg total) by mouth daily. 08/11/14  Yes Jola Schmidt, MD  hydrochlorothiazide (MICROZIDE) 12.5 MG capsule Take 1 capsule (12.5 mg total) by mouth daily. Patient not taking: Reported on 08/06/2014 06/14/14  Lily Kocher, PA-C  HYDROcodone-acetaminophen (NORCO/VICODIN) 5-325 MG per tablet Take 1 tablet by mouth every 4 (four) hours as needed for moderate pain. Patient not taking: Reported on 08/14/2014 08/13/14   Manon Hilding Kefalas, PA-C  LORazepam (ATIVAN) 1 MG tablet Take 1 tablet (1 mg total) by mouth 3  (three) times daily as needed for anxiety. Patient not taking: Reported on 08/17/2014 08/14/14   Lily Kocher, PA-C  oxycodone (OXY-IR) 5 MG capsule 1 or 2 q6h prn pain Patient not taking: Reported on 08/17/2014 08/14/14   Lily Kocher, PA-C  prochlorperazine (COMPAZINE) 10 MG tablet Take 1 tablet (10 mg total) by mouth every 6 (six) hours as needed for nausea or vomiting. Patient not taking: Reported on 08/14/2014 08/13/14   Baird Cancer, PA-C   Physical Exam: Filed Vitals:   08/17/14 1115 08/17/14 1130 08/17/14 1152 08/17/14 1227  BP:  124/98 124/98 128/97  Pulse:   102 102  Temp:   98.5 F (36.9 C) 98.8 F (37.1 C)  TempSrc:   Oral Oral  Resp: '14 14 20   '$ Height:    '5\' 6"'$  (1.676 m)  Weight:      SpO2:   92% 95%    Wt Readings from Last 3 Encounters:  08/17/14 68.04 kg (150 lb)  08/17/14 68.04 kg (150 lb)  08/17/14 68.04 kg (150 lb)    General:  Appears calm and comfortable; 49 year old [place] national [place] man who appears ill, but in no acute distress. Eyes: PERRL, normal lids, irises & conjunctiva; conjunctivae are mildly injected; sclerae are with a brownish yellow tint. ENT: grossly normal hearing; oropharynx with dry mucous membranes. Neck: no LAD, masses or thyromegaly Cardiovascular: RRR, no m/r/g. No LE edema. Telemetry: SR, no arrhythmias  Respiratory: Globally decreased breath sounds on the left clear breath sounds on the right; breathing nonlabored at rest. Abdomen: soft, positive bowel sounds, mildly diffusely tender; question palpated stool burden in the left lower quadrant. Skin: There is a soft cystic like lesion on his for head measuring 3 cm; pliable and nontender. Musculoskeletal: Patient has exquisite tenderness over his shoulder girdle and around his axilla upon palpation; no hot acute joints. Psychiatric: He is alert and oriented 3, but has a flat affect. His speech is clear, but he is slow to answer. Neurologic: grossly non-focal. he is able to raise  each leg against gravity approximately 40; sensation to cold touch intact; cranial nerves appear to be grossly intact. Gait not assessed.           Labs on Admission:  Basic Metabolic Panel:  Recent Labs Lab 08/11/14 0540 08/14/14 0845 08/17/14 0301  NA 137 135 138  K 3.9 4.1 4.1  CL 101 98 101  CO2 '22 26 25  '$ GLUCOSE 106* 98 88  BUN '18 14 16  '$ CREATININE 0.77 0.60 0.75  CALCIUM 9.6 9.4 9.0   Liver Function Tests:  Recent Labs Lab 08/14/14 0845 08/17/14 0301  AST 18 13*  ALT 11 9*  ALKPHOS 81 72  BILITOT 0.9 0.9  PROT 8.0 7.6  ALBUMIN 3.5 3.2*    Recent Labs Lab 08/17/14 0301  LIPASE 16*   No results for input(s): AMMONIA in the last 168 hours. CBC:  Recent Labs Lab 08/11/14 0540 08/14/14 0845 08/17/14 0301  WBC 11.3* 10.3 11.0*  NEUTROABS 7.8* 7.0 8.0*  HGB 16.3 15.7 14.6  HCT 45.5 45.2 42.4  MCV 91.9 92.6 93.4  PLT 299 370 353   Cardiac Enzymes:  Recent Labs Lab  08/17/14 0301  TROPONINI <0.03    BNP (last 3 results)  Recent Labs  08/06/14 1609  BNP 16.0    ProBNP (last 3 results) No results for input(s): PROBNP in the last 8760 hours.  CBG: No results for input(s): GLUCAP in the last 168 hours.  Radiological Exams on Admission: Dg Chest 2 View  08/17/2014   CLINICAL DATA:  Right arm pain and dyspnea for 3 weeks.  EXAM: CHEST  2 VIEW  COMPARISON:  08/11/2014  FINDINGS: There is left base consolidation and left pleural effusion, worsened from 08/11/2014. There is left hilar adenopathy, unchanged. The right lung is clear.  IMPRESSION: Enlarging left effusion. Worsening left base consolidation. Known left hilar and mediastinal masses.   Electronically Signed   By: Andreas Newport M.D.   On: 08/17/2014 04:53   Mr Lumbar Spine W Wo Contrast  08/17/2014   CLINICAL DATA:  Lumbar spine metastasis.  Lung cancer.  EXAM: MRI LUMBAR SPINE WITHOUT AND WITH CONTRAST  TECHNIQUE: Multiplanar and multiecho pulse sequences of the lumbar spine were  obtained without and with intravenous contrast.  CONTRAST:  55m MULTIHANCE GADOBENATE DIMEGLUMINE 529 MG/ML IV SOLN  COMPARISON:  None.  FINDINGS: Known small ascites, left pleural effusion with malignant appearing pleural thickening, and right adrenal mass. There is a enhancing T2 hyperintense 5 mm mass in the intrinsic back muscles on the right at the level of T12  There is an infiltrating enhancing mass in the L1 right pedicle, articular process, body, and transverse process consistent with metastasis. There is extra osseous tumor effacing the right epidural canal and infiltrating the inferior right T12-L1 foramen. No cord or foraminal nerve compression.  No abnormal intrathecal enhancement.  No notable degenerative change.  IMPRESSION: 1. L1 right body and posterior element metastasis with epidural and right T12-L1 foraminal infiltration. No cord or foraminal nerve compression. 2. Malignant appearing left pleural effusion with small ascites and right adrenal metastasis. There is a 5 mm soft tissue metastasis in the right intrinsic back muscles at the T12 level.   Electronically Signed   By: JMonte FantasiaM.D.   On: 08/17/2014 08:53   Ct Abdomen Pelvis W Contrast  08/17/2014   CLINICAL DATA:  Recently diagnosed lung cancer.  Mid abdominal pain.  EXAM: CT ABDOMEN AND PELVIS WITH CONTRAST  TECHNIQUE: Multidetector CT imaging of the abdomen and pelvis was performed using the standard protocol following bolus administration of intravenous contrast.  CONTRAST:  541mOMNIPAQUE IOHEXOL 300 MG/ML SOLN, 10017mMNIPAQUE IOHEXOL 300 MG/ML SOLN  COMPARISON:  08/11/2014 the large left effusion and left lower lobe collapse is again noted.  No hepatic metastases are evident. There is a low-attenuation right adrenal mass measuring 2.4 x 3.5 cm and a low-attenuation left adrenal mass measuring 1.1 x 1.7 cm. These are suspicious for hematogenous metastases. The spleen, pancreas and kidneys appear normal.  There is a small  volume peritoneal ascites.  There is no bowel obstruction.  There is no extraluminal air.  There is a lytic lesion involving the right L1 pedicle with pathologic fracture. There is slight soft tissue encroachment on the right lateral aspect of the central canal at L1. There may also be soft tissue extending up into the right T12-L1 neural foramen from the L1 metastasis. There is a lytic lesion involving the left tenth rib posteriorly.  FINDINGS: *Skeletal metastasis to the right L1 pedicle with pathologic fracture and with slight soft tissue encroachment on the right lateral aspect of the central canal.  There may also be soft tissue encroachment on the right T12-L1 foramen. Lumbar MRI recommended for optimal characterization. *Additional skeletal metastasis to the left tenth rib posteriorly. *Bilateral adrenal masses, probably hematogenous metastases *Peritoneal ascites   Electronically Signed   By: Andreas Newport M.D.   On: 08/17/2014 04:12   Mr Shoulder Right Wo Contrast  08/17/2014   CLINICAL DATA:  Right shoulder pain for 3 months. No known injury. History of lung cancer.  EXAM: MRI OF THE RIGHT SHOULDER WITHOUT CONTRAST  TECHNIQUE: Multiplanar, multisequence MR imaging of the shoulder was performed. No intravenous contrast was administered.  COMPARISON:  CT chest 08/11/2014 and 08/06/2014.  FINDINGS: Rotator cuff:  Intact and unremarkable.  Muscles: There is a mass lesion in the infraspinatus muscle belly measuring 2.3 cm craniocaudal by 3.0 cm transverse by 1.9 cm AP is consistent with a metastatic lesion. Intense surrounding soft tissue edema is identified.  Biceps long head:  Intact.  Acromioclavicular Joint: Mild to moderate degenerative change is seen with marrow edema about the joint.  Glenohumeral Joint: Unremarkable.  Labrum: The superior labrum appears degenerated but no focal tear is identified.  Bones:  The acromion is type 2.  No worrisome marrow lesion is seen.  IMPRESSION: Soft mass lesion in  the infraspinatus muscle belly is most consistent with metastatic disease. Surrounding soft tissue edema is noted.  Intact rotator cuff  Mild to moderate acromioclavicular osteoarthritis.   Electronically Signed   By: Inge Rise M.D.   On: 08/17/2014 08:52    EKG: Independently reviewed.   Assessment/Plan Principal Problem:   Right arm pain Active Problems:   Primary lung adenocarcinoma   Bone metastasis   Malignant pleural effusion   Metastasis to adrenal gland   Low back pain   Essential hypertension   1. Presenting symptoms with right arm pain, shortness of breath, low back pain, diffuse abdominal pain. Etiology likely secondary to metastatic disease involving the right infraspinatus muscle belly, the L1 right body with metastasis with epidural and right T12-L1 foraminal infiltration; recurrent enlarging malignant left pleural effusion; mild ascites; and a probable constipation. We'll treat the patient's pain with scheduled oxycodone at 5 mg 4 times a day; when necessary IV hydromorphone; 3 times a day dosing of ibuprofen. Will treat his pleural effusion with another thoracentesis with fluid sent for cell count and culture. We'll treat his constipation with twice a day dosing of MiraLAX and Dulcolax suppository. 2. Presumed primary lung adenocarcinoma with metastasis. Per oncology, the plan was to refer the patient to CVT S for more tissue sampling for treatment guidance and port placement. The plan was also for the patient to be evaluated with an MRI of the brain and PET scan to complete staging. Oncology PA, [name] has been notified of the patient's admission. We will go ahead and order MRI of his brain. 3. Essential hypertension. Will hold hydrochlorothiazide for now and gently hydrate. Will provide as needed IV hydralazine.    Code Status: Full code DVT Prophylaxis: Subcutaneous heparin Family Communication: Family not available Disposition Plan: Discharge when clinically  appropriate  Time spent: One hour  [facility] Pager [phone]

## 2014-08-17 NOTE — ED Notes (Signed)
Patient ambulated hallway, O2-Sats 92-93& HR:126.

## 2014-08-17 NOTE — Consult Note (Signed)
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  HISTORY OF PRESENT ILLNESS:   49 year old black American man who is recently known to the [facility] when he presented to the ED last week with enlarging lung mass.  My note from 08/13/2014 is available in [facility] for details.  He presents to the ED today, 08/17/2014, with SOB.  Dr. Caryn Section called and I confirmed my outpatient plan with her and confirmed that Mr. Lassen is appropriate for hospitalization at [facility] and not for transfer to [facility].  Palliative radiation at this time is not indicated as the bulk of his symptoms requires systemic therapy, not radiation and would only decrease the patient's reserve and delay systemic intervention further.   I personally reviewed and went over laboratory results with the patient.  The results are noted within this dictation.  I personally reviewed and went over radiographic studies with the patient.  The results are noted within this dictation.  MRI of L-spine demonstrates a L1 metastasis with epidural infiltration without cord compression. He has an MRI of brain ordered.  If his MRI of brain is negative for disease, then we can considered CSF evaluation.  I broached the topic of code status and he admits that he has never considered this.  He will think about what interventions he would want done or not done.    He is informed of his Stage IV disease and the fact that it is incurable.  He is educated that systemic treatment for his malignancy is very intense and difficult.  His performance status is a 3 at this time and he is not a candidate for systemic therapy to date.  We will need to re-evaluate and discuss goals of  care.   PAST MEDICAL HISTORY:   Past Medical History  Diagnosis Date  . Primary lung adenocarcinoma 08/17/2014    ALLERGIES: No Known Allergies    MEDICATIONS: I have reviewed the patient's current medications.     PAST SURGICAL HISTORY Past Surgical History  Procedure Laterality Date  . Appendectomy      FAMILY HISTORY: Family History  Problem Relation Age of Onset  . Diabetes Mother     SOCIAL HISTORY:  reports that he quit smoking about 4 months ago. His smoking use included Cigarettes. He has a 10 pack-year smoking history. He has never used smokeless tobacco. He reports that he uses illicit drugs (Marijuana). He reports that he does not drink alcohol.  PERFORMANCE STATUS: The patient's performance status is 3 - Symptomatic, >50% confined to bed  PHYSICAL EXAM: Most Recent Vital Signs: Blood pressure 128/97, pulse 102, temperature 98.8 F (37.1 C), temperature source Oral, resp. rate 20, height 5' 6" (1.676 m), weight 150 lb (68.04 kg), SpO2 95 %. General appearance: alert, cooperative, mild distress and slowed mentation Head: Normocephalic, without obvious abnormality, atraumatic Eyes: positive findings: non-icteric Neck: no adenopathy and supple, symmetrical, trachea midline Back: symmetric, no curvature. ROM normal. No CVA tenderness. Lungs: diminished breath sounds LLL and LUL Heart: regular rate and rhythm Extremities: extremities normal, atraumatic, no cyanosis or edema Skin: Skin color, texture, turgor normal. No rashes or lesions Neurologic: Mental status: alertness: alert, orientation: time, date,  affect: normal, thought content exhibits tangential connections  LABORATORY DATA:  Results for orders placed or performed during the hospital encounter of 08/17/14 (from the past 48 hour(s))  CBC with Differential/Platelet     Status: Abnormal   Collection Time: 08/17/14  3:01 AM  Result Value Ref Range   WBC 11.0 (H) 4.0 - 10.5 K/uL   RBC 4.54 4.22 - 5.81  MIL/uL   Hemoglobin 14.6 13.0 - 17.0 g/dL   HCT 42.4 39.0 - 52.0 %   MCV 93.4 78.0 - 100.0 fL   MCH 32.2 26.0 - 34.0 pg   MCHC 34.4 30.0 - 36.0 g/dL   RDW 12.8 11.5 - 15.5 %   Platelets 353 150 - 400 K/uL   Neutrophils Relative % 72 43 - 77 %   Neutro Abs 8.0 (H) 1.7 - 7.7 K/uL   Lymphocytes Relative 13 12 - 46 %   Lymphs Abs 1.5 0.7 - 4.0 K/uL   Monocytes Relative 9 3 - 12 %   Monocytes Absolute 1.0 0.1 - 1.0 K/uL   Eosinophils Relative 5 0 - 5 %   Eosinophils Absolute 0.5 0.0 - 0.7 K/uL   Basophils Relative 1 0 - 1 %   Basophils Absolute 0.1 0.0 - 0.1 K/uL  Comprehensive metabolic panel     Status: Abnormal   Collection Time: 08/17/14  3:01 AM  Result Value Ref Range   Sodium 138 135 - 145 mmol/L   Potassium 4.1 3.5 - 5.1 mmol/L   Chloride 101 101 - 111 mmol/L   CO2 25 22 - 32 mmol/L   Glucose, Bld 88 70 - 99 mg/dL   BUN 16 6 - 20 mg/dL   Creatinine, Ser 0.75 0.61 - 1.24 mg/dL   Calcium 9.0 8.9 - 10.3 mg/dL   Total Protein 7.6 6.5 - 8.1 g/dL   Albumin 3.2 (L) 3.5 - 5.0 g/dL   AST 13 (L) 15 - 41 U/L   ALT 9 (L) 17 - 63 U/L   Alkaline Phosphatase 72 38 - 126 U/L   Total Bilirubin 0.9 0.3 - 1.2 mg/dL   GFR calc non Af Amer >60 >60 mL/min   GFR calc Af Amer >60 >60 mL/min    Comment: (NOTE) The eGFR has been calculated using the CKD EPI equation. This calculation has not been validated in all clinical situations. eGFR's persistently <90 mL/min signify possible Chronic Kidney Disease.    Anion gap 12 5 - 15  Lipase, blood     Status: Abnormal   Collection Time: 08/17/14  3:01 AM  Result Value Ref Range   Lipase 16 (L) 22 - 51 U/L  Troponin I     Status: None   Collection Time: 08/17/14  3:01 AM  Result Value Ref Range   Troponin I <0.03 <0.031 ng/mL    Comment:        NO INDICATION OF MYOCARDIAL INJURY.       RADIOGRAPHY: Dg Chest 2 View  08/17/2014   CLINICAL DATA:  Right arm pain and dyspnea for 3 weeks.  EXAM: CHEST  2 VIEW  COMPARISON:  08/11/2014   FINDINGS: There is left base consolidation and left pleural effusion, worsened from 08/11/2014. There is left hilar adenopathy, unchanged. The right lung is clear.  IMPRESSION: Enlarging left effusion. Worsening left base consolidation. Known left hilar and mediastinal masses.   Electronically Signed   By: Andreas Newport M.D.   On: 08/17/2014 04:53   Mr Lumbar Spine W Wo Contrast  08/17/2014   CLINICAL DATA:  Lumbar spine metastasis.  Lung cancer.  EXAM: MRI LUMBAR SPINE WITHOUT AND WITH CONTRAST  TECHNIQUE: Multiplanar and multiecho pulse sequences of the lumbar spine were obtained without and with intravenous contrast.  CONTRAST:  87m MULTIHANCE GADOBENATE DIMEGLUMINE 529 MG/ML IV SOLN  COMPARISON:  None.  FINDINGS: Known small ascites, left pleural effusion with malignant appearing pleural thickening, and right adrenal mass. There is a enhancing T2 hyperintense 5 mm mass in the intrinsic back muscles on the right at the level of T12  There is an infiltrating enhancing mass in the L1 right pedicle, articular process, body, and transverse process consistent with metastasis. There is extra osseous tumor effacing the right epidural canal and infiltrating the inferior right T12-L1 foramen. No cord or foraminal nerve compression.  No abnormal intrathecal enhancement.  No notable degenerative change.  IMPRESSION: 1. L1 right body and posterior element metastasis with epidural and right T12-L1 foraminal infiltration. No cord or foraminal nerve compression. 2. Malignant appearing left pleural effusion with small ascites and right adrenal metastasis. There is a 5 mm soft tissue metastasis in the right intrinsic back muscles at the T12 level.   Electronically Signed   By: JMonte FantasiaM.D.   On: 08/17/2014 08:53   Ct Abdomen Pelvis W Contrast  08/17/2014   CLINICAL DATA:  Recently diagnosed lung cancer.  Mid abdominal pain.  EXAM: CT ABDOMEN AND PELVIS WITH CONTRAST  TECHNIQUE: Multidetector CT imaging of the  abdomen and pelvis was performed using the standard protocol following bolus administration of intravenous contrast.  CONTRAST:  523mOMNIPAQUE IOHEXOL 300 MG/ML SOLN, 10025mMNIPAQUE IOHEXOL 300 MG/ML SOLN  COMPARISON:  08/11/2014 the large left effusion and left lower lobe collapse is again noted.  No hepatic metastases are evident. There is a low-attenuation right adrenal mass measuring 2.4 x 3.5 cm and a low-attenuation left adrenal mass measuring 1.1 x 1.7 cm. These are suspicious for hematogenous metastases. The spleen, pancreas and kidneys appear normal.  There is a small volume peritoneal ascites.  There is no bowel obstruction.  There is no extraluminal air.  There is a lytic lesion involving the right L1 pedicle with pathologic fracture. There is slight soft tissue encroachment on the right lateral aspect of the central canal at L1. There may also be soft tissue extending up into the right T12-L1 neural foramen from the L1 metastasis. There is a lytic lesion involving the left tenth rib posteriorly.  FINDINGS: *Skeletal metastasis to the right L1 pedicle with pathologic fracture and with slight soft tissue encroachment on the right lateral aspect of the central canal. There may also be soft tissue encroachment on the right T12-L1 foramen. Lumbar MRI recommended for optimal characterization. *Additional skeletal metastasis to the left tenth rib posteriorly. *Bilateral adrenal masses, probably hematogenous metastases *Peritoneal ascites   Electronically Signed   By: DanAndreas NewportD.   On: 08/17/2014 04:12   Mr Shoulder Right Wo Contrast  08/17/2014   CLINICAL DATA:  Right shoulder pain for 3 months. No known injury. History of lung cancer.  EXAM: MRI OF THE RIGHT SHOULDER WITHOUT CONTRAST  TECHNIQUE: Multiplanar, multisequence MR imaging of the shoulder was performed. No intravenous contrast was administered.  COMPARISON:  CT chest 08/11/2014 and 08/06/2014.  FINDINGS: Rotator cuff:  Intact and  unremarkable.  Muscles: There is a mass lesion in the infraspinatus muscle belly measuring 2.3 cm craniocaudal by 3.0 cm transverse by 1.9 cm AP is consistent with a metastatic lesion.  Intense surrounding soft tissue edema is identified.  Biceps long head:  Intact.  Acromioclavicular Joint: Mild to moderate degenerative change is seen with marrow edema about the joint.  Glenohumeral Joint: Unremarkable.  Labrum: The superior labrum appears degenerated but no focal tear is identified.  Bones:  The acromion is type 2.  No worrisome marrow lesion is seen.  IMPRESSION: Soft mass lesion in the infraspinatus muscle belly is most consistent with metastatic disease. Surrounding soft tissue edema is noted.  Intact rotator cuff  Mild to moderate acromioclavicular osteoarthritis.   Electronically Signed   By: Inge Rise M.D.   On: 08/17/2014 08:52       PATHOLOGY:    08/09/2014  Diagnosis PLEURAL FLUID, LEFT (SPECIMEN 1 OF 1 COLLECTED 08/09/14): MALIGNANT CELLS CONSISTENT WITH METASTATIC ADENOCARCINOMA.    ASSESSMENT:  1. Stage IV adenocarcinoma, presumed lung primary.  Not enough tissue for ALK/EGFR testing. 2. Malignant plueral effusion 3. L1 vertebral metastasis with epidural invasion 4. Soft tissue metastasis at T12 level.   PLAN:  1. I personally reviewed and went over laboratory results with the patient.  The results are noted within this dictation. 2. I personally reviewed and went over radiographic studies with the patient.  The results are noted within this dictation.   3. Chart reviewed. 4. Change pain medication regimen  A. Duragesic 25 mcg  B. Continue short acting pain medication as ordered. 5. Pamidronate 60 mg IV today 6. MRI of brain today/tomorrow. 7. Dexamethasone 8 mg every 8 hours IV ordered for presumed CNS involvement of disease. 8. Will have Rad Onc review imaging once MRI of brain is complete if brain disease is noted.  Will consider palliative radiation to L-spine  lesion as outpatient. 9. Will need to consider Hospice depending on hospital course (+/- palliative radiation) 10. Performance status will determine the utility of systemic chemotherapy. 11. Broached code status, he will think about his options.  Incurability of disease discussed.  Intense systemic chemotherapy reviewed briefly, and if he does not improve in the short term, he is not a systemic chemotherapy candidate with a performance status > 2. 12. Will follow along as an inpatient.  All questions were answered. The patient knows to call the clinic with any problems, questions or concerns. We can certainly see the patient much sooner if necessary.  Patient and plan discussed with Dr. Ancil Linsey and she is in agreement with the aforementioned.   KEFALAS,THOMAS  ,08/17/2014 6:32 PM    Pending the results of brain MRI he may benefit from palliative XRT however I am concerned about his rapidly declining PS, At this point hospice may be the most appropriate option for Mr. Frady. DNR status has been addressed, end of life issues are being discussed. Patient is not unreasonable and willing to consider all options. Donald Pore MD

## 2014-08-18 ENCOUNTER — Inpatient Hospital Stay (HOSPITAL_COMMUNITY): Payer: Medicaid Other

## 2014-08-18 DIAGNOSIS — C3490 Malignant neoplasm of unspecified part of unspecified bronchus or lung: Secondary | ICD-10-CM

## 2014-08-18 DIAGNOSIS — C799 Secondary malignant neoplasm of unspecified site: Secondary | ICD-10-CM

## 2014-08-18 DIAGNOSIS — M79601 Pain in right arm: Secondary | ICD-10-CM

## 2014-08-18 DIAGNOSIS — I10 Essential (primary) hypertension: Secondary | ICD-10-CM

## 2014-08-18 LAB — COMPREHENSIVE METABOLIC PANEL
ALT: 8 U/L — AB (ref 17–63)
AST: 11 U/L — AB (ref 15–41)
Albumin: 2.8 g/dL — ABNORMAL LOW (ref 3.5–5.0)
Alkaline Phosphatase: 61 U/L (ref 38–126)
Anion gap: 11 (ref 5–15)
BILIRUBIN TOTAL: 0.7 mg/dL (ref 0.3–1.2)
BUN: 16 mg/dL (ref 6–20)
CO2: 24 mmol/L (ref 22–32)
Calcium: 9.1 mg/dL (ref 8.9–10.3)
Chloride: 102 mmol/L (ref 101–111)
Creatinine, Ser: 0.58 mg/dL — ABNORMAL LOW (ref 0.61–1.24)
GFR calc Af Amer: >60 mL/min (ref >60)
GLUCOSE: 125 mg/dL — AB (ref 70–99)
Potassium: 4.6 mmol/L (ref 3.5–5.1)
SODIUM: 137 mmol/L (ref 135–145)
Total Protein: 7 g/dL (ref 6.5–8.1)

## 2014-08-18 LAB — CBC
HCT: 39.5 % (ref 39.0–52.0)
HEMOGLOBIN: 13.4 g/dL (ref 13.0–17.0)
MCH: 31.7 pg (ref 26.0–34.0)
MCHC: 33.9 g/dL (ref 30.0–36.0)
MCV: 93.4 fL (ref 78.0–100.0)
Platelets: 417 10*3/uL — ABNORMAL HIGH (ref 150–400)
RBC: 4.23 MIL/uL (ref 4.22–5.81)
RDW: 12.8 % (ref 11.5–15.5)
WBC: 9 10*3/uL (ref 4.0–10.5)

## 2014-08-18 MED ORDER — GADOBENATE DIMEGLUMINE 529 MG/ML IV SOLN
14.0000 mL | Freq: Once | INTRAVENOUS | Status: AC | PRN
  Administered 2014-08-18: 15 mL via INTRAVENOUS

## 2014-08-18 MED ORDER — FENTANYL 25 MCG/HR TD PT72: 25.0000 ug | MEDICATED_PATCH | TRANSDERMAL | Status: DC

## 2014-08-18 MED ORDER — OXYCODONE HCL 5 MG PO TABS: 5.0000 mg | ORAL_TABLET | Freq: Three times a day (TID) | ORAL | Status: DC

## 2014-08-18 MED ORDER — DEXAMETHASONE 4 MG PO TABS: 8.0000 mg | ORAL_TABLET | Freq: Three times a day (TID) | ORAL | Status: AC

## 2014-08-18 NOTE — Discharge Summary (Signed)
Physician Discharge Summary  [name] RKY:[id number] DOB: 1965-11-04 DOA: 08/17/2014  PCP: No PCP Per Patient  Admit date: 08/17/2014 Discharge date: 08/18/2014  Time spent: 45 minutes  Recommendations for Outpatient Follow-up:  -Will be discharged home today. -Will go to the [facility] in [place] tomorrow for his radiation simulation.  -Will follow up with Dr. pen line in the office next week.   Discharge Diagnoses:  Principal Problem:   Right arm pain Active Problems:   Primary lung adenocarcinoma   Bone metastasis   Malignant pleural effusion   Essential hypertension   Low back pain   Metastasis to adrenal gland   Lung cancer, primary, with metastasis from lung to other site   Discharge Condition: [name]   08/17/14 0205 08/18/14 0500  Weight: 68.04 kg (150 lb) 70.67 kg (155 lb 12.8 oz)    History of present illness:  [name] is a 49 y.o. male with a recent diagnosis of metastatic lung cancer. He was just hospitalized from 08/06/2014 through 08/10/2014 for newly diagnosed lung mass causing a large left pleural effusion. The patient had a thoracentesis performed and the fluid was sent for cytology, but the result was pending at the time of discharge. The patient followed up with oncology on 08/13/2014. Per [name] note, the pathology confirmed adenocarcinoma, but there needed to be more tissue sampling for further testing to guide treatment. Over the last few days, the patient has had worsening right arm pain, more proximal than distal; more shortness of breath; and more abdominal pain. The pain in his right arm is such that he has difficulty lifting it. He is right-handed. The shortness of breath is worse with movement or activity, and less so at rest. He has had some pleuritic chest pain in the center and on the left. He complains of diffuse abdominal pain and it has been associated with nausea, but no vomiting, diarrhea, or bloody  stools. He says he has not had a bowel movement in 1 or 2 weeks. He denies pain with urination. He also has a headache, occasional dizziness, and blurred vision/decrease in vision of his left eye. He has also had worsening low back pain with the pain radiating to right greater than left legs; associated numbness in both legs; and mild weakness, but he says that he can still ambulate. He has no difficulty with urination, but he is constipated.  In the ED, he was afebrile and hemodynamically stable. CT of abdomen and pelvis revealed skeletal metastasis to the right L1 pedicle with pathologic fracture; bilateral adrenal masses; and mild peritoneal ascites. Chest x-ray revealed enlarging left pleural effusion worsening left base consolidation and known left hilar and mediastinal masses. MRI of the right shoulder revealed soft tissue mass in the infraspinous muscle belly consistent with metastatic disease. MRI of his lumbar spine revealed L1 metastasis with epidural and right T12-L1 foraminal infiltration; no cord or foraminal nerve compression; and malignant-appearing left pleural effusion with small ascites in right adrenal metastasis. He is being admitted for further evaluation and management.     Hospital Course:   Metastatic lung cancer -To multiple areas, most importantly MRI of brain showed numerous metastases with surrounding vasogenic edema. -His pain is well controlled on fentanyl patch and oxycodone, will continue this treatment as an outpatient. -Discussed via telephone with oncology, Dr. Whitney Muse, plan to discharge him this evening and follow-up tomorrow morning for his first radiation session of the brain. He remains  a poor candidate for systemic chemotherapy, I have discussed with both patient and patient's sister, [name], his poor overall prognosis. -Do not believe he needs repeat thoracentesis given shortness of breath has resolved and is not requiring any oxygen at present, but may certainly  need this in the near future.  Procedures:  None   Consultations:  Oncology, Dr. Whitney Muse  Discharge Instructions  Discharge Instructions    Increase activity slowly    Complete by:  As directed             Medication List    STOP taking these medications        hydrochlorothiazide 12.5 MG capsule  Commonly known as:  MICROZIDE     HYDROcodone-acetaminophen 5-325 MG per tablet  Commonly known as:  NORCO/VICODIN     levofloxacin 500 MG tablet  Commonly known as:  LEVAQUIN     oxycodone 5 MG capsule  Commonly known as:  OXY-IR  Replaced by:  oxyCODONE 5 MG immediate release tablet     prochlorperazine 10 MG tablet  Commonly known as:  COMPAZINE      TAKE these medications        dexamethasone 4 MG tablet  Commonly known as:  DECADRON  Take 2 tablets (8 mg total) by mouth 3 (three) times daily.     fentaNYL 25 MCG/HR patch  Commonly known as:  DURAGESIC - dosed mcg/hr  Place 1 patch (25 mcg total) onto the skin every 3 (three) days.     hydrocortisone cream 1 %  Apply topically 2 (two) times daily as needed for itching.     LORazepam 1 MG tablet  Commonly known as:  ATIVAN  Take 1 tablet (1 mg total) by mouth 3 (three) times daily as needed for anxiety.     oxyCODONE 5 MG immediate release tablet  Commonly known as:  Oxy IR/ROXICODONE  Take 1 tablet (5 mg total) by mouth 4 (four) times daily - after meals and at bedtime.       No Known Allergies     Follow-up Information    Follow up On 08/19/2014.   Why:  [facility] in [place] at 9:30 am [address]       The results of significant diagnostics from this hospitalization (including imaging, microbiology, ancillary and laboratory) are listed below for reference.    Significant Diagnostic Studies: Dg Chest 1 View  08/09/2014   CLINICAL DATA:  Status post left-sided thoracentesis  EXAM: CHEST  1 VIEW  COMPARISON:  Chest radiograph and chest CT August 06, 2014  FINDINGS: Most of the  left effusion has been removed by thoracentesis. No pneumothorax. A small left effusion remains with patchy atelectasis in the left mid and lower lung zones. There is moderate consolidation in the medial left base. There is minimal right base atelectasis. Lungs are otherwise clear. Heart size and pulmonary vascularity are within normal limits. There is apparent left hilar adenopathy, appreciated on recent CT.  IMPRESSION: No pneumothorax following thoracentesis. Medial left base consolidation with small left effusion and patchy left mid lower lung zone atelectasis. Minimal right base atelectasis. Left hilar adenopathy.   Electronically Signed   By: Lowella Grip III M.D.   On: 08/09/2014 15:13   Dg Chest 2 View  08/17/2014   CLINICAL DATA:  Right arm pain and dyspnea for 3 weeks.  EXAM: CHEST  2 VIEW  COMPARISON:  08/11/2014  FINDINGS: There is left base consolidation and left pleural effusion,  worsened from 08/11/2014. There is left hilar adenopathy, unchanged. The right lung is clear.  IMPRESSION: Enlarging left effusion. Worsening left base consolidation. Known left hilar and mediastinal masses.   Electronically Signed   By: Andreas Newport M.D.   On: 08/17/2014 04:53   Dg Chest 2 View  08/11/2014   CLINICAL DATA:  Acute onset of right shoulder pain and weakness. Initial encounter.  EXAM: CHEST  2 VIEW  COMPARISON:  Chest radiograph performed 08/09/2014, and CT of the chest performed 08/06/2014  FINDINGS: The lungs are well-aerated. An increasing small left pleural effusion is noted, with patchy left basilar airspace opacity. This may reflect postobstructive pneumonia, superimposed on the patient's known left hilar lung mass. The right lung appears clear.  The heart is normal in size. No acute osseous abnormalities are seen.  IMPRESSION: Increasing small left pleural effusion, with patchy left basilar airspace opacity. This may reflect postobstructive pneumonia, superimposed on the patient's known left  hilar lung mass.   Electronically Signed   By: Garald Balding M.D.   On: 08/11/2014 06:13   Dg Chest 2 View  08/06/2014   CLINICAL DATA:  Tachycardia. Blurred vision in the left on a for 3 weeks. Pain in weakness in the right upper extremity and right-sided chest today. History of smoking.  EXAM: CHEST  2 VIEW  COMPARISON:  None.  FINDINGS: The heart is enlarged. There is significant opacity in the left hemithorax which consists of pleural effusion and atelectasis or consolidation. The right lung is clear. No pulmonary edema.  IMPRESSION: 1. Cardiomegaly without edema. 2. Significant left lung opacity consistent with atelectasis or infiltrate and pleural effusion.   Electronically Signed   By: Nolon Nations M.D.   On: 08/06/2014 16:41   Dg Shoulder Right  08/11/2014   CLINICAL DATA:  Acute onset of right shoulder pain and weakness. Initial encounter.  EXAM: RIGHT SHOULDER - 2+ VIEW  COMPARISON:  Right shoulder radiographs performed 06/14/2014  FINDINGS: There is no evidence of fracture or dislocation. The right humeral head is seated within the glenoid fossa. The acromioclavicular joint is unremarkable in appearance. No significant soft tissue abnormalities are seen. The visualized portions of the right lung are clear.  IMPRESSION: No evidence of fracture or dislocation.   Electronically Signed   By: Garald Balding M.D.   On: 08/11/2014 06:11   Ct Soft Tissue Neck W Contrast  08/14/2014   CLINICAL DATA:  Right-sided neck pain radiating to the right arm. New diagnosis metastatic lung cancer.  EXAM: CT NECK WITH CONTRAST  TECHNIQUE: Multidetector CT imaging of the neck was performed using the standard protocol following the bolus administration of intravenous contrast.  CONTRAST:  72m OMNIPAQUE IOHEXOL 300 MG/ML  SOLN  COMPARISON:  CT chest 08/11/2014  FINDINGS: Pharynx and larynx: No mucosal or submucosal lesion.  Salivary glands: Parotid and submandibular glands are normal.  Thyroid: Normal  Lymph  nodes: No enlarged or low-density nodes in the neck or supraclavicular region. Superior mediastinal disease as shown on previous chest CT.  Vascular: Arterial and venous structures are patent.  Limited intracranial: Negative  Visualized orbits: Normal  Mastoids and visualized paranasal sinuses: Clear  Skeleton: Lytic metastasis to the T3 vertebral body again demonstrated. The degree of involvement of the spinal canal is not clearly shown by CT. No metastatic lesions seen in the cervical spine or the skullbase region.  Upper chest: See results of chest CT.  IMPRESSION: No finding in the neck to explain right-sided neck pain  or right arm symptoms. No evidence of mass or lymphadenopathy in the neck.  Metastatic lung cancer as previously shown with extensive involvement in the mediastinum and a lytic metastatic lesion to the left side of the T3 vertebral body. The extent of spinal canal involvement is not precisely shown by CT.   Electronically Signed   By: Nelson Chimes M.D.   On: 08/14/2014 09:44   Ct Chest W Contrast  08/06/2014   CLINICAL DATA:  Hypotension and tachycardia. Left pleural effusion. Smoker  EXAM: CT CHEST WITH CONTRAST  TECHNIQUE: Multidetector CT imaging of the chest was performed during intravenous contrast administration.  CONTRAST:  30m OMNIPAQUE IOHEXOL 300 MG/ML  SOLN  COMPARISON:  Chest x-ray today  FINDINGS: Image quality degraded by motion.  Large left pleural effusion with compressive atelectasis of the left lower lobe. Left upper lobe is partially expanded. There is a probable mass lesion in the left hilum measuring 28 mm with probable adjacent left hilar adenopathy. Findings are suspicious for carcinoma of the lung. 12 mm right paratracheal lymph node. 12 mm lymph node anterior to the left bronchus. No subcarinal enlarged lymph nodes.  Right lung is clear.  No lung nodule is identified.  Bony destructive lesions compatible with metastatic disease. There is a lesion on the right involving  T3. Lesion in the left L1 vertebral body and pedicle extending into the lamina. Left-sided rib lesions.  Right adrenal enlargement measuring 26 mm. This has low density and most likely is adrenal adenoma rather than metastatic disease.  12 mm right thyroid lesion is indeterminate.  IMPRESSION: Large left pleural effusion with compressive atelectasis left lower lobe. There is a probable mass the left hilum compatible with carcinoma of the lung. PET CT suggested for staging. Bony metastatic disease is noted.  Right adrenal lesion most likely a benign adenoma rather than metastatic disease.  12 mm right thyroid lesion.  Thyroid ultrasound recommended.   Electronically Signed   By: CFranchot GalloM.D.   On: 08/06/2014 18:59   Ct Angio Chest Pe W/cm &/or Wo Cm  08/11/2014   CLINICAL DATA:  Acute onset of shortness breath and right-sided back pain. Initial encounter.  EXAM: CT ANGIOGRAPHY CHEST WITH CONTRAST  TECHNIQUE: Multidetector CT imaging of the chest was performed using the standard protocol during bolus administration of intravenous contrast. Multiplanar CT image reconstructions and MIPs were obtained to evaluate the vascular anatomy.  CONTRAST:  1076mOMNIPAQUE IOHEXOL 350 MG/ML SOLN  COMPARISON:  Chest radiograph performed earlier today at 5:48 a.m., and CT of the chest performed 08/06/2014  FINDINGS: There is no evidence of pulmonary embolus.  A small to moderate left-sided pleural effusion is noted, with partial consolidation of much of the left lower lobe. This raises concern for postobstructive pneumonia, given the masses at the left hilum. The largest mass measures approximately 3.6 x 3.1 cm, while a smaller 2.6 cm mass is seen more inferiorly at the left hilum.  There is mass effect on the pulmonary artery to the left lower lobe and its branches, which passes between the masses. Vague fluid attenuation is seen tracking along the left side of the mediastinum, concerning for a malignant pericardial  effusion given its focal appearance. Underlying soft tissue inflammation is seen.  There is no evidence of pneumothorax. The right lung appears clear. A 1.4 cm right-sided peribronchial node is seen, also concerning for metastatic disease.  Aortopulmonary window nodes measure up to 1.4 cm in short axis. This is concerning for metastatic  disease. The great vessels are grossly unremarkable in appearance. Incidental note is made of a direct origin of the left vertebral artery from the aortic arch. No axillary lymphadenopathy is seen. The visualized portions of the thyroid gland are unremarkable in appearance.  The visualized portions of the liver and spleen are unremarkable.  No acute osseous abnormalities are seen. A prominent 2.0 cm lytic mass is noted within the left side of vertebral body T3, without definite evidence of mass effect on the spinal canal at this time.  Review of the MIP images confirms the above findings.  IMPRESSION: 1. No evidence of pulmonary embolus. 2. Small to moderate left-sided pleural effusion noted, with partial consolidation of much of the left lower lobe. This raises concern for postobstructive pneumonia, given the masses at the left hilum. The largest mass measures 3.6 cm 3.6 x 2.1 cm, while a smaller 2.6 cm mass is seen more inferiorly at the left hilum. 3. Mass-effect on the pulmonary artery to the left lower lobe and its branches, which passes between the masses. Vague fluid attenuation tracking along the left-sided mediastinum raises concern for a malignant pericardial effusion, given its focal appearance. Underlying soft tissue inflammation within the mediastinum. 4. 1.4 cm right-sided peribronchial node ulcer raises concern for metastatic disease. Aortopulmonary window nodes measure up to 1.4 cm in short axis. 5. Prominent 2.0 cm lytic mass noted within the left side of vertebral body T3, without definite evidence of mass-effect on the spinal canal this time. This is compatible  with bony metastatic disease.   Electronically Signed   By: Garald Balding M.D.   On: 08/11/2014 06:55   Mr Jeri Cos TD Contrast  08/18/2014   CLINICAL DATA:  Lung cancer with metastatic disease.  EXAM: MRI HEAD WITHOUT AND WITH CONTRAST  TECHNIQUE: Multiplanar, multiecho pulse sequences of the brain and surrounding structures were obtained without and with intravenous contrast.  CONTRAST:  69m MULTIHANCE GADOBENATE DIMEGLUMINE 529 MG/ML IV SOLN  COMPARISON:  CT neck with contrast 08/14/2014. CT head without contrast 03/26/2013  FINDINGS: Multiple ring-enhancing mass lesions are present throughout both cerebral hemispheres.  The largest lesion is in the right centrum semi ovale in measuring 13.5 x 14.0 x 19.0 mm. There are 2 lesions in the right basal ganglia which measure 17 x 13 mm and 16 x 11 mm respectively. An immediately adjacent lesion is present along the right insular cortex measuring 12 mm. High anterior frontal lobe lesions measure 17.5 x 17.0 cm on the left and 12 x 10 mm on the right. The right-sided lesion appears to involve the dura. A lesion in the left centrum semi ovale measures 9.5 mm. A high posterior right parietal lesion images 11 mm on image 76 of series 10. More inferiorly a right parietal lesion measures 11 mm on image 61. A lesion in the left thalamus measures 14 x 12 mm. A lesion in the right cerebral peduncle extending to the midbrain measures 14.5 x 12.5 x 14.0 mm. This partially extends into the third ventricle. A lesion in the right side of the cerebellum measures 12 x 8.5 mm. A smaller left cerebellar lesion measures 4 mm maximally.  Other smaller lesions bilaterally would be better characterized by with the 3 T exam a if SRS treatment is considered.  Lesions less than 5 mm are better seen on the T2 and FLAIR sequences were a second left cerebellar lesion is present. A lesion in the right cerebellar tonsil is noted. A lesion on the left  side of the cerebellar vermis is present.   Multiple additional lesions are present within the left temporal lobe and bilateral frontal lobes.  There is significant edema surrounding multiple lesions, particularly in the right basal ganglia and right centrum semi ovale. There is restricted diffusion associated with the lesions. The T1 shortening is evident within the left ally MAC lesion compatible with acute hemorrhage.  Ventricles are of normal size.  The globes and orbits are intact.  Flow is present in the major intracranial arteries. The paranasal sinuses and mastoid air cells are clear.  Remote encephalomalacia is present in the anterior frontal lobes bilaterally involving predominantly the gyrus rectus.  IMPRESSION: 1. Numerous bilateral peripherally enhancing lesions with restricted diffusion and surrounding vasogenic edema compatible with numerous metastases. The largest lesions are described above. 2. Acute/subacute blood products are noted within the left thalamic lesion.   Electronically Signed   By: San Morelle M.D.   On: 08/18/2014 13:29   Mr Lumbar Spine W Wo Contrast  08/17/2014   CLINICAL DATA:  Lumbar spine metastasis.  Lung cancer.  EXAM: MRI LUMBAR SPINE WITHOUT AND WITH CONTRAST  TECHNIQUE: Multiplanar and multiecho pulse sequences of the lumbar spine were obtained without and with intravenous contrast.  CONTRAST:  110m MULTIHANCE GADOBENATE DIMEGLUMINE 529 MG/ML IV SOLN  COMPARISON:  None.  FINDINGS: Known small ascites, left pleural effusion with malignant appearing pleural thickening, and right adrenal mass. There is a enhancing T2 hyperintense 5 mm mass in the intrinsic back muscles on the right at the level of T12  There is an infiltrating enhancing mass in the L1 right pedicle, articular process, body, and transverse process consistent with metastasis. There is extra osseous tumor effacing the right epidural canal and infiltrating the inferior right T12-L1 foramen. No cord or foraminal nerve compression.  No abnormal  intrathecal enhancement.  No notable degenerative change.  IMPRESSION: 1. L1 right body and posterior element metastasis with epidural and right T12-L1 foraminal infiltration. No cord or foraminal nerve compression. 2. Malignant appearing left pleural effusion with small ascites and right adrenal metastasis. There is a 5 mm soft tissue metastasis in the right intrinsic back muscles at the T12 level.   Electronically Signed   By: JMonte FantasiaM.D.   On: 08/17/2014 08:53   Ct Abdomen Pelvis W Contrast  08/17/2014   CLINICAL DATA:  Recently diagnosed lung cancer.  Mid abdominal pain.  EXAM: CT ABDOMEN AND PELVIS WITH CONTRAST  TECHNIQUE: Multidetector CT imaging of the abdomen and pelvis was performed using the standard protocol following bolus administration of intravenous contrast.  CONTRAST:  547mOMNIPAQUE IOHEXOL 300 MG/ML SOLN, 100102mMNIPAQUE IOHEXOL 300 MG/ML SOLN  COMPARISON:  08/11/2014 the large left effusion and left lower lobe collapse is again noted.  No hepatic metastases are evident. There is a low-attenuation right adrenal mass measuring 2.4 x 3.5 cm and a low-attenuation left adrenal mass measuring 1.1 x 1.7 cm. These are suspicious for hematogenous metastases. The spleen, pancreas and kidneys appear normal.  There is a small volume peritoneal ascites.  There is no bowel obstruction.  There is no extraluminal air.  There is a lytic lesion involving the right L1 pedicle with pathologic fracture. There is slight soft tissue encroachment on the right lateral aspect of the central canal at L1. There may also be soft tissue extending up into the right T12-L1 neural foramen from the L1 metastasis. There is a lytic lesion involving the left tenth rib posteriorly.  FINDINGS: *Skeletal metastasis  to the right L1 pedicle with pathologic fracture and with slight soft tissue encroachment on the right lateral aspect of the central canal. There may also be soft tissue encroachment on the right T12-L1 foramen.  Lumbar MRI recommended for optimal characterization. *Additional skeletal metastasis to the left tenth rib posteriorly. *Bilateral adrenal masses, probably hematogenous metastases *Peritoneal ascites   Electronically Signed   By: Andreas Newport M.D.   On: 08/17/2014 04:12   Mr Shoulder Right Wo Contrast  08/17/2014   CLINICAL DATA:  Right shoulder pain for 3 months. No known injury. History of lung cancer.  EXAM: MRI OF THE RIGHT SHOULDER WITHOUT CONTRAST  TECHNIQUE: Multiplanar, multisequence MR imaging of the shoulder was performed. No intravenous contrast was administered.  COMPARISON:  CT chest 08/11/2014 and 08/06/2014.  FINDINGS: Rotator cuff:  Intact and unremarkable.  Muscles: There is a mass lesion in the infraspinatus muscle belly measuring 2.3 cm craniocaudal by 3.0 cm transverse by 1.9 cm AP is consistent with a metastatic lesion. Intense surrounding soft tissue edema is identified.  Biceps long head:  Intact.  Acromioclavicular Joint: Mild to moderate degenerative change is seen with marrow edema about the joint.  Glenohumeral Joint: Unremarkable.  Labrum: The superior labrum appears degenerated but no focal tear is identified.  Bones:  The acromion is type 2.  No worrisome marrow lesion is seen.  IMPRESSION: Soft mass lesion in the infraspinatus muscle belly is most consistent with metastatic disease. Surrounding soft tissue edema is noted.  Intact rotator cuff  Mild to moderate acromioclavicular osteoarthritis.   Electronically Signed   By: Inge Rise M.D.   On: 08/17/2014 08:52   US Thoracentesis Asp Pleural Space W/img Guide  08/09/2014   CLINICAL DATA:  Left pleural effusion  EXAM: ULTRASOUND GUIDED LEFT THORACENTESIS  COMPARISON:  Plain film and CT 08/06/2014  PROCEDURE: An ultrasound guided thoracentesis was thoroughly discussed with the patient and questions answered. The benefits, risks, alternatives and complications were also discussed. The patient understands and wishes to  proceed with the procedure. Written consent was obtained.  Ultrasound was performed to localize and mark an adequate pocket of fluid in the left posterior chest. The area was then prepped and draped in the normal sterile fashion. 1% Lidocaine was used for local anesthesia. Under ultrasound guidance a 19 gauge Yueh catheter was introduced. Thoracentesis was performed. The catheter was removed and a dressing applied.  Complications:  None.  FINDINGS: A total of approximately 1.85 L of amber fluid was removed. A fluid sample wassent for laboratory analysis.  IMPRESSION: Successful ultrasound guided left thoracentesis yielding 1.85 L of pleural fluid.   Electronically Signed   By: Rolm Baptise M.D.   On: 08/09/2014 15:13    Microbiology: Recent Results (from the past 240 hour(s))  Body fluid culture     Status: None   Collection Time: 08/09/14  2:30 PM  Result Value Ref Range Status   Specimen Description FLUID LEFT PLEURAL  Final   Special Requests NONE  Final   Gram Stain   Final    RARE WBC PRESENT, PREDOMINANTLY MONONUCLEAR NO ORGANISMS SEEN Performed at [facility]    Culture   Final    NO GROWTH 3 DAYS Performed at [facility]    Report Status 08/13/2014 FINAL  Final     Labs: Basic Metabolic Panel:  Recent Labs Lab 08/14/14 0845 08/17/14 0301 08/18/14 0652  NA 135 138 137  K 4.1 4.1 4.6  CL 98 101 102  CO2 '26 25 24  '$ GLUCOSE 98 88 125*  BUN '14 16 16  '$ CREATININE 0.60 0.75 0.58*  CALCIUM 9.4 9.0 9.1   Liver Function Tests:  Recent Labs Lab 08/14/14 0845 08/17/14 0301 08/18/14 0652  AST 18 13* 11*  ALT 11 9* 8*  ALKPHOS 81 72 61  BILITOT 0.9 0.9 0.7  PROT 8.0 7.6 7.0  ALBUMIN 3.5 3.2* 2.8*    Recent Labs Lab 08/17/14 0301  LIPASE 16*   No results for input(s): AMMONIA in the last 168 hours. CBC:  Recent Labs Lab 08/14/14 0845 08/17/14 0301 08/18/14 0652  WBC 10.3 11.0* 9.0  NEUTROABS 7.0 8.0*  --   HGB 15.7 14.6 13.4  HCT 45.2  42.4 39.5  MCV 92.6 93.4 93.4  PLT 370 353 417*   Cardiac Enzymes:  Recent Labs Lab 08/17/14 0301  TROPONINI <0.03   BNP: BNP (last 3 results)  Recent Labs  08/06/14 1609  BNP 16.0    ProBNP (last 3 results) No results for input(s): PROBNP in the last 8760 hours.  CBG: No results for input(s): GLUCAP in the last 168 hours.     SignedLelon Frohlich  [facility] Pager: [phone] 08/18/2014, 3:55 PM

## 2014-08-18 NOTE — Progress Notes (Signed)
Present with patient for support and to discuss information about Advance Directives. Explained the material and he said he wasn't ready to complete. Offered to be present with him for support and he stated he was "okay". Will follow up with him.

## 2014-08-18 NOTE — Care Management Note (Signed)
Case Management Note  Patient Details  Name: [name] MRN: [id number] Date of Birth: November 15, 1965  Expected Discharge Date:  08/19/14               Expected Discharge Plan:  Home/Self Care  In-House Referral:  Financial Counselor  Discharge planning Services  CM Consult, [facility]  Post Acute Care Choice:  NA Choice offered to:  NA  DME Arranged:    DME Agency:     HH Arranged:    [place] Agency:     Status of Service:  Completed, signed off  Medicare Important Message Given:    Date Medicare IM Given:    Medicare IM give by:    Date Additional Medicare IM Given:    Additional Medicare Important Message give by:     If discussed at [place] of Stay Meetings, dates discussed:    Additional Comments: Pt will be discharging home today with self care and close f/u with cancer clinic. Pt given MATCH voucher. No further CM needs.  Sherald Barge, RN 08/18/2014, 3:29 PM

## 2014-08-18 NOTE — Progress Notes (Signed)
INITIAL NUTRITION ASSESSMENT  DOCUMENTATION CODES Per approved criteria  -Not Applicable   INTERVENTION: Ensure Enlive po BID, each supplement provides 350 kcal and 20 grams of protein  Recommend laxative vs Enema for severe constipation  Monitor oral intake and change supplements/add snacks as warranted  If patient is appropriate for cancer treatment, will visit patient again to discuss nutrition during chemotherapy  NUTRITION DIAGNOSIS: Inadequate oral intake related to gastric pains as evidenced by loss of atleast 5 pounds in the last four months.   Goal: Pt to meet >/= 90% of their estimated nutrition needs   Monitor:  Oral intake, weight, labs, mental status, plans regarding cancer treatment  Reason for Assessment: MST  49 y.o. male  Admitting Dx: Right arm pain  ASSESSMENT: 49 y.o. male with a recent diagnosis of metastatic lung cancer. He was just hospitalized from 08/06/2014 through 08/10/2014 for a cancer caused pleural effusion. Pt now presents from R arm pain, likely 2/2 metastatic disease. Also notes nausea and constipation (no BM in 1-2 weeks)  Pt states that he has a reduced appetite for about 4 months now d/t stomach pains. He also reports nausea and constipation.   He seemed a little confused when I talked to him and could not remember if he had a BM last night or not (there is not one documented) He also couldn't really think of how many meals he had been eating a day.   He reports his normal weight is 155 lbs and he thinks he has lost a little weight.   Declined snacks  Nutrition Focused Physical Exam: WDL   Height: Ht Readings from Last 1 Encounters:  08/17/14 '5\' 6"'$  (1.676 m)    Weight: Wt Readings from Last 1 Encounters:  08/18/14 155 lb 12.8 oz (70.67 kg)    Ideal Body Weight: 142 lbs  % Ideal Body Weight: 109%  Wt Readings from Last 10 Encounters:  08/18/14 155 lb 12.8 oz (70.67 kg)  08/18/14 155 lb (70.308 kg)  08/17/14 150 lb (68.04  kg)  08/17/14 150 lb (68.04 kg)  08/14/14 145 lb (65.772 kg)  08/13/14 161 lb 6.4 oz (73.211 kg)  08/11/14 170 lb (77.111 kg)  08/07/14 173 lb 6.4 oz (78.654 kg)  07/09/14 155 lb (70.308 kg)  06/14/14 150 lb (68.04 kg)  4/22-4/26 pt admitted. Elevated weights likely from fluid status. Will use admit weight, 150 lbs, for dosing  Usual Body Weight: 155  % Usual Body Weight: 97%  BMI:  Body mass index is 25.16 kg/(m^2).  Estimated Nutritional Needs: Kcal: 2100-2300 (31-34 kcal/kg) Protein: 89-102 (1.3-1.5 g/kg) Fluid: 2.1 liters  Skin: WDL  Diet Order:  Regular  EDUCATION NEEDS: -Education not appropriate at this time-pt seemed dazed/confused   Intake/Output Summary (Last 24 hours) at 08/18/14 1333 Last data filed at 08/18/14 0839  Gross per 24 hour  Intake 2597.5 ml  Output      0 ml  Net 2597.5 ml    Last BM: 4/23  Labs:   Recent Labs Lab 08/14/14 0845 08/17/14 0301 08/18/14 0652  NA 135 138 137  K 4.1 4.1 4.6  CL 98 101 102  CO2 '26 25 24  '$ BUN '14 16 16  '$ CREATININE 0.60 0.75 0.58*  CALCIUM 9.4 9.0 9.1  GLUCOSE 98 88 125*    CBG (last 3)  No results for input(s): GLUCAP in the last 72 hours.  Scheduled Meds: . dexamethasone  8 mg Intravenous 3 times per day  . feeding supplement (ENSURE ENLIVE)  237 mL Oral BID BM  . fentaNYL  25 mcg Transdermal Q72H  . heparin  5,000 Units Subcutaneous 3 times per day  . ibuprofen  400 mg Oral TID  . oxyCODONE  5 mg Oral TID PC & HS  . polyethylene glycol  17 g Oral BID    Continuous Infusions: . 0.9 % NaCl with KCl 20 mEq / L 10 mL/hr at 08/18/14 1056    Past Medical History  Diagnosis Date  . Primary lung adenocarcinoma 08/17/2014    Past Surgical History  Procedure Laterality Date  . Appendectomy      Burtis Junes RD, LDN Nutrition Pager: [phone] 08/18/2014 1:34 PM

## 2014-08-18 NOTE — Progress Notes (Signed)
[facility] PROGRESS NOTE  [name] JFH:[id number] DOB: May 20, 1965 DOA: 08/17/2014 PCP: No PCP Per Patient  Assessment/Plan: 1. Right arm pain, shortness of breath, low back pain, diffuse abdominal pain. Etiology likely secondary to metastatic disease involving the right infraspinatus muscle belly, the L1 right body with metastasis with epidural and right T12-L1 foraminal infiltration; recurrent enlarging malignant left pleural effusion; mild ascites; and a probable constipation. Pain controlled today. Has been getting IV dilaudid with fentanyl patch and ibuprofen. Will discontinue IV dilaudid and provide Oxy IR.  SOB resolved. sats 96% on room air. Await thoracentesis.  2. 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 Per oncology, the plan was to refer the patient to CVT S for more tissue sampling for treatment guidance and port placement. The plan was also for the patient to be evaluated with an MRI of the brain and PET scan to complete staging Await MRI brain.  Evaluated by oncology who opine palliative radiation not indicated presently as symptoms require systemic therapy. In addition if MRI brain negative then CSF evaluation will be considered. Hospice to be considered depending on MRI brain results.  3. Essential hypertension. Controlled. Continue to hold hydrochlorothiazide for now.  4. L1 vertebral metastasis with epidural invasion: see #1 5. Soft tissue metastasis T12. See #1.   Code Status: full. Has been discussed. Awaiting results MRI brain Family Communication: none present Disposition Plan: home when ready may be with Hospice   Consultants:  oncology  Procedures:  none  Antibiotics:  none  HPI/Subjective: Reports improved pain management   Objective: Filed Vitals:   08/18/14 0500  BP: 123/87  Pulse: 92  Temp: 97.6 F (36.4 C)  Resp: 18    Intake/Output Summary (Last 24 hours) at 08/18/14 1056 Last data filed at 08/18/14 0839  Gross per 24 hour   Intake 2597.5 ml  Output      0 ml  Net 2597.5 ml   Filed Weights   08/17/14 0205 08/18/14 0500  Weight: 68.04 kg (150 lb) 70.67 kg (155 lb 12.8 oz)    Exam:   General:  Thin somewhat frail appearing  Cardiovascular: rrr no MGR no LE edema  Respiratory: normal effort , BS distant particularly on left. Some crackles heard  Abdomen: non-distended +BS non-tender  Musculoskeletal: no swelling/erythema   Data Reviewed: Basic Metabolic Panel:  Recent Labs Lab 08/14/14 0845 08/17/14 0301 08/18/14 0652  NA 135 138 137  K 4.1 4.1 4.6  CL 98 101 102  CO2 '26 25 24  '$ GLUCOSE 98 88 125*  BUN '14 16 16  '$ CREATININE 0.60 0.75 0.58*  CALCIUM 9.4 9.0 9.1   Liver Function Tests:  Recent Labs Lab 08/14/14 0845 08/17/14 0301 08/18/14 0652  AST 18 13* 11*  ALT 11 9* 8*  ALKPHOS 81 72 61  BILITOT 0.9 0.9 0.7  PROT 8.0 7.6 7.0  ALBUMIN 3.5 3.2* 2.8*    Recent Labs Lab 08/17/14 0301  LIPASE 16*   No results for input(s): AMMONIA in the last 168 hours. CBC:  Recent Labs Lab 08/14/14 0845 08/17/14 0301 08/18/14 0652  WBC 10.3 11.0* 9.0  NEUTROABS 7.0 8.0*  --   HGB 15.7 14.6 13.4  HCT 45.2 42.4 39.5  MCV 92.6 93.4 93.4  PLT 370 353 417*   Cardiac Enzymes:  Recent Labs Lab 08/17/14 0301  TROPONINI <0.03   BNP (last 3 results)  Recent Labs  08/06/14 1609  BNP 16.0    ProBNP (last 3 results) No results for input(s):  PROBNP in the last 8760 hours.  CBG: No results for input(s): GLUCAP in the last 168 hours.  Recent Results (from the past 240 hour(s))  Body fluid culture     Status: None   Collection Time: 08/09/14  2:30 PM  Result Value Ref Range Status   Specimen Description FLUID LEFT PLEURAL  Final   Special Requests NONE  Final   Gram Stain   Final    RARE WBC PRESENT, PREDOMINANTLY MONONUCLEAR NO ORGANISMS SEEN Performed at [facility]    Culture   Final    NO GROWTH 3 DAYS Performed at [facility]    Report Status  08/13/2014 FINAL  Final     Studies: Dg Chest 2 View  08/17/2014   CLINICAL DATA:  Right arm pain and dyspnea for 3 weeks.  EXAM: CHEST  2 VIEW  COMPARISON:  08/11/2014  FINDINGS: There is left base consolidation and left pleural effusion, worsened from 08/11/2014. There is left hilar adenopathy, unchanged. The right lung is clear.  IMPRESSION: Enlarging left effusion. Worsening left base consolidation. Known left hilar and mediastinal masses.   Electronically Signed   By: Andreas Newport M.D.   On: 08/17/2014 04:53   Mr Lumbar Spine W Wo Contrast  08/17/2014   CLINICAL DATA:  Lumbar spine metastasis.  Lung cancer.  EXAM: MRI LUMBAR SPINE WITHOUT AND WITH CONTRAST  TECHNIQUE: Multiplanar and multiecho pulse sequences of the lumbar spine were obtained without and with intravenous contrast.  CONTRAST:  3m MULTIHANCE GADOBENATE DIMEGLUMINE 529 MG/ML IV SOLN  COMPARISON:  None.  FINDINGS: Known small ascites, left pleural effusion with malignant appearing pleural thickening, and right adrenal mass. There is a enhancing T2 hyperintense 5 mm mass in the intrinsic back muscles on the right at the level of T12  There is an infiltrating enhancing mass in the L1 right pedicle, articular process, body, and transverse process consistent with metastasis. There is extra osseous tumor effacing the right epidural canal and infiltrating the inferior right T12-L1 foramen. No cord or foraminal nerve compression.  No abnormal intrathecal enhancement.  No notable degenerative change.  IMPRESSION: 1. L1 right body and posterior element metastasis with epidural and right T12-L1 foraminal infiltration. No cord or foraminal nerve compression. 2. Malignant appearing left pleural effusion with small ascites and right adrenal metastasis. There is a 5 mm soft tissue metastasis in the right intrinsic back muscles at the T12 level.   Electronically Signed   By: JMonte FantasiaM.D.   On: 08/17/2014 08:53   Ct Abdomen Pelvis W  Contrast  08/17/2014   CLINICAL DATA:  Recently diagnosed lung cancer.  Mid abdominal pain.  EXAM: CT ABDOMEN AND PELVIS WITH CONTRAST  TECHNIQUE: Multidetector CT imaging of the abdomen and pelvis was performed using the standard protocol following bolus administration of intravenous contrast.  CONTRAST:  574mOMNIPAQUE IOHEXOL 300 MG/ML SOLN, 1004mMNIPAQUE IOHEXOL 300 MG/ML SOLN  COMPARISON:  08/11/2014 the large left effusion and left lower lobe collapse is again noted.  No hepatic metastases are evident. There is a low-attenuation right adrenal mass measuring 2.4 x 3.5 cm and a low-attenuation left adrenal mass measuring 1.1 x 1.7 cm. These are suspicious for hematogenous metastases. The spleen, pancreas and kidneys appear normal.  There is a small volume peritoneal ascites.  There is no bowel obstruction.  There is no extraluminal air.  There is a lytic lesion involving the right L1 pedicle with pathologic fracture. There is slight soft tissue encroachment  on the right lateral aspect of the central canal at L1. There may also be soft tissue extending up into the right T12-L1 neural foramen from the L1 metastasis. There is a lytic lesion involving the left tenth rib posteriorly.  FINDINGS: *Skeletal metastasis to the right L1 pedicle with pathologic fracture and with slight soft tissue encroachment on the right lateral aspect of the central canal. There may also be soft tissue encroachment on the right T12-L1 foramen. Lumbar MRI recommended for optimal characterization. *Additional skeletal metastasis to the left tenth rib posteriorly. *Bilateral adrenal masses, probably hematogenous metastases *Peritoneal ascites   Electronically Signed   By: Andreas Newport M.D.   On: 08/17/2014 04:12   Mr Shoulder Right Wo Contrast  08/17/2014   CLINICAL DATA:  Right shoulder pain for 3 months. No known injury. History of lung cancer.  EXAM: MRI OF THE RIGHT SHOULDER WITHOUT CONTRAST  TECHNIQUE: Multiplanar,  multisequence MR imaging of the shoulder was performed. No intravenous contrast was administered.  COMPARISON:  CT chest 08/11/2014 and 08/06/2014.  FINDINGS: Rotator cuff:  Intact and unremarkable.  Muscles: There is a mass lesion in the infraspinatus muscle belly measuring 2.3 cm craniocaudal by 3.0 cm transverse by 1.9 cm AP is consistent with a metastatic lesion. Intense surrounding soft tissue edema is identified.  Biceps long head:  Intact.  Acromioclavicular Joint: Mild to moderate degenerative change is seen with marrow edema about the joint.  Glenohumeral Joint: Unremarkable.  Labrum: The superior labrum appears degenerated but no focal tear is identified.  Bones:  The acromion is type 2.  No worrisome marrow lesion is seen.  IMPRESSION: Soft mass lesion in the infraspinatus muscle belly is most consistent with metastatic disease. Surrounding soft tissue edema is noted.  Intact rotator cuff  Mild to moderate acromioclavicular osteoarthritis.   Electronically Signed   By: Inge Rise M.D.   On: 08/17/2014 08:52    Scheduled Meds: . dexamethasone  8 mg Intravenous 3 times per day  . feeding supplement (ENSURE ENLIVE)  237 mL Oral BID BM  . fentaNYL  25 mcg Transdermal Q72H  . heparin  5,000 Units Subcutaneous 3 times per day  . ibuprofen  400 mg Oral TID  . oxyCODONE  5 mg Oral TID PC & HS  . pneumococcal 23 valent vaccine  0.5 mL Intramuscular Tomorrow-1000  . polyethylene glycol  17 g Oral BID   Continuous Infusions: . 0.9 % NaCl with KCl 20 mEq / L 75 mL/hr at 08/18/14 9983    Principal Problem:   Right arm pain Active Problems:   Primary lung adenocarcinoma   Bone metastasis   Malignant pleural effusion   Essential hypertension   Low back pain   Metastasis to adrenal gland   Lung cancer, primary, with metastasis from lung to other site    Time spent: 68 minutes    [facility] Pager [phone]. If 7PM-7AM, please contact night-coverage at  [email or web address], password [facility] 08/18/2014, 10:56 AM  LOS: 1 day

## 2014-08-18 NOTE — Progress Notes (Signed)
Discharge instructions and prescriptions given to patient's sister who verbalized understanding, out in stable condition via w/c with staff.

## 2014-08-18 NOTE — Care Management Note (Signed)
Case Management Note  Patient Details  Name: [name] MRN: [id number] Date of Birth: Nov 02, 1965  Pt from home, independent at baseline. Pt recently discharged from hospital. Pt active at [facility]. Pt also followed by Dr. Whitney Muse at [facility]. Will cont to follow, awaiting test results to determine DC home with self care vs DC to/with Hospice.   Expected Discharge Date:  08/19/14               Expected Discharge Plan:  Home/Self Care  In-House Referral:  Financial Counselor  Discharge planning Services  CM Consult  Post Acute Care Choice:  NA Choice offered to:  NA  DME Arranged:    DME Agency:     HH Arranged:    HH Agency:     Status of Service:  In process, will continue to follow  Medicare Important Message Given:    Date Medicare IM Given:    Medicare IM give by:    Date Additional Medicare IM Given:    Additional Medicare Important Message give by:     If discussed at [place] of Stay Meetings, dates discussed:    Additional Comments:  Sherald Barge, RN 08/18/2014, 2:13 PM

## 2014-08-19 ENCOUNTER — Ambulatory Visit (HOSPITAL_COMMUNITY): Admission: RE | Admit: 2014-08-19 | Payer: MEDICAID | Source: Ambulatory Visit

## 2014-08-20 ENCOUNTER — Other Ambulatory Visit (HOSPITAL_COMMUNITY): Payer: Self-pay | Admitting: Oncology

## 2014-08-20 DIAGNOSIS — C3490 Malignant neoplasm of unspecified part of unspecified bronchus or lung: Secondary | ICD-10-CM

## 2014-08-20 DIAGNOSIS — J90 Pleural effusion, not elsewhere classified: Secondary | ICD-10-CM

## 2014-08-22 ENCOUNTER — Emergency Department (HOSPITAL_COMMUNITY): Payer: Medicaid Other

## 2014-08-22 ENCOUNTER — Encounter (HOSPITAL_COMMUNITY): Payer: Self-pay

## 2014-08-22 ENCOUNTER — Inpatient Hospital Stay (HOSPITAL_COMMUNITY)
Admission: EM | Admit: 2014-08-22 | Discharge: 2014-08-25 | DRG: 180 | Disposition: A | Discharge status: Hospice | Payer: Medicaid Other | Attending: Internal Medicine | Admitting: Internal Medicine

## 2014-08-22 DIAGNOSIS — T40605A Adverse effect of unspecified narcotics, initial encounter: Secondary | ICD-10-CM | POA: Diagnosis present

## 2014-08-22 DIAGNOSIS — G936 Cerebral edema: Secondary | ICD-10-CM | POA: Diagnosis present

## 2014-08-22 DIAGNOSIS — C3492 Malignant neoplasm of unspecified part of left bronchus or lung: Secondary | ICD-10-CM | POA: Diagnosis present

## 2014-08-22 DIAGNOSIS — Z720 Tobacco use: Secondary | ICD-10-CM | POA: Diagnosis present

## 2014-08-22 DIAGNOSIS — C7951 Secondary malignant neoplasm of bone: Secondary | ICD-10-CM | POA: Diagnosis present

## 2014-08-22 DIAGNOSIS — Z66 Do not resuscitate: Secondary | ICD-10-CM | POA: Diagnosis present

## 2014-08-22 DIAGNOSIS — T380X5A Adverse effect of glucocorticoids and synthetic analogues, initial encounter: Secondary | ICD-10-CM | POA: Diagnosis present

## 2014-08-22 DIAGNOSIS — R0600 Dyspnea, unspecified: Secondary | ICD-10-CM | POA: Diagnosis not present

## 2014-08-22 DIAGNOSIS — J90 Pleural effusion, not elsewhere classified: Secondary | ICD-10-CM | POA: Diagnosis present

## 2014-08-22 DIAGNOSIS — K5900 Constipation, unspecified: Secondary | ICD-10-CM | POA: Diagnosis present

## 2014-08-22 DIAGNOSIS — D72829 Elevated white blood cell count, unspecified: Secondary | ICD-10-CM | POA: Diagnosis present

## 2014-08-22 DIAGNOSIS — C799 Secondary malignant neoplasm of unspecified site: Secondary | ICD-10-CM | POA: Diagnosis not present

## 2014-08-22 DIAGNOSIS — Z9889 Other specified postprocedural states: Secondary | ICD-10-CM

## 2014-08-22 DIAGNOSIS — I10 Essential (primary) hypertension: Secondary | ICD-10-CM | POA: Diagnosis present

## 2014-08-22 DIAGNOSIS — Z85118 Personal history of other malignant neoplasm of bronchus and lung: Secondary | ICD-10-CM

## 2014-08-22 DIAGNOSIS — E440 Moderate protein-calorie malnutrition: Secondary | ICD-10-CM | POA: Diagnosis present

## 2014-08-22 DIAGNOSIS — C3490 Malignant neoplasm of unspecified part of unspecified bronchus or lung: Secondary | ICD-10-CM | POA: Diagnosis present

## 2014-08-22 DIAGNOSIS — R0602 Shortness of breath: Secondary | ICD-10-CM | POA: Diagnosis present

## 2014-08-22 DIAGNOSIS — R4182 Altered mental status, unspecified: Secondary | ICD-10-CM

## 2014-08-22 DIAGNOSIS — Z7401 Bed confinement status: Secondary | ICD-10-CM | POA: Diagnosis not present

## 2014-08-22 DIAGNOSIS — Z833 Family history of diabetes mellitus: Secondary | ICD-10-CM | POA: Diagnosis not present

## 2014-08-22 DIAGNOSIS — J910 Malignant pleural effusion: Secondary | ICD-10-CM | POA: Diagnosis present

## 2014-08-22 DIAGNOSIS — Z515 Encounter for palliative care: Secondary | ICD-10-CM

## 2014-08-22 DIAGNOSIS — R079 Chest pain, unspecified: Secondary | ICD-10-CM

## 2014-08-22 LAB — BASIC METABOLIC PANEL
ANION GAP: 10 (ref 5–15)
BUN: 16 mg/dL (ref 6–20)
CHLORIDE: 103 mmol/L (ref 101–111)
CO2: 25 mmol/L (ref 22–32)
CREATININE: 0.57 mg/dL — AB (ref 0.61–1.24)
Calcium: 8.2 mg/dL — ABNORMAL LOW (ref 8.9–10.3)
GFR calc Af Amer: >60 mL/min (ref >60)
GFR calc non Af Amer: >60 mL/min (ref >60)
GLUCOSE: 113 mg/dL — AB (ref 70–99)
Potassium: 3.8 mmol/L (ref 3.5–5.1)
Sodium: 138 mmol/L (ref 135–145)

## 2014-08-22 LAB — CBC WITH DIFFERENTIAL/PLATELET
Basophils Absolute: 0 10*3/uL (ref 0.0–0.1)
Basophils Relative: 0 % (ref 0–1)
Eosinophils Absolute: 0.4 10*3/uL (ref 0.0–0.7)
Eosinophils Relative: 3 % (ref 0–5)
HCT: 45.5 % (ref 39.0–52.0)
Hemoglobin: 15.8 g/dL (ref 13.0–17.0)
LYMPHS PCT: 5 % — AB (ref 12–46)
Lymphs Abs: 0.9 10*3/uL (ref 0.7–4.0)
MCH: 32.2 pg (ref 26.0–34.0)
MCHC: 34.7 g/dL (ref 30.0–36.0)
MCV: 92.9 fL (ref 78.0–100.0)
MONOS PCT: 6 % (ref 3–12)
Monocytes Absolute: 1 10*3/uL (ref 0.1–1.0)
NEUTROS ABS: 15.2 10*3/uL — AB (ref 1.7–7.7)
Neutrophils Relative %: 86 % — ABNORMAL HIGH (ref 43–77)
Platelets: 427 10*3/uL — ABNORMAL HIGH (ref 150–400)
RBC: 4.9 MIL/uL (ref 4.22–5.81)
RDW: 12.9 % (ref 11.5–15.5)
WBC: 17.6 10*3/uL — AB (ref 4.0–10.5)

## 2014-08-22 LAB — TROPONIN I: Troponin I: <0.03 ng/mL (ref <0.031)

## 2014-08-22 LAB — LACTIC ACID, PLASMA
LACTIC ACID, VENOUS: 1.8 mmol/L (ref 0.5–2.0)
Lactic Acid, Venous: 2 mmol/L (ref 0.5–2.0)

## 2014-08-22 MED ORDER — DEXAMETHASONE 4 MG PO TABS
ORAL_TABLET | ORAL | Status: AC
  Filled 2014-08-22: qty 2

## 2014-08-22 MED ORDER — SODIUM CHLORIDE 0.9 % IV SOLN
INTRAVENOUS | Status: DC
  Administered 2014-08-22 – 2014-08-24 (×4): via INTRAVENOUS

## 2014-08-22 MED ORDER — PIPERACILLIN-TAZOBACTAM 3.375 G IVPB
INTRAVENOUS | Status: AC
  Filled 2014-08-22: qty 50

## 2014-08-22 MED ORDER — SODIUM CHLORIDE 0.9 % IV SOLN
INTRAVENOUS | Status: AC
  Administered 2014-08-22: 19:00:00 via INTRAVENOUS

## 2014-08-22 MED ORDER — DEXAMETHASONE 4 MG PO TABS
8.0000 mg | ORAL_TABLET | Freq: Three times a day (TID) | ORAL | Status: DC
  Administered 2014-08-22 – 2014-08-25 (×8): 8 mg via ORAL
  Filled 2014-08-22 (×15): qty 2

## 2014-08-22 MED ORDER — VANCOMYCIN HCL IN DEXTROSE 1-5 GM/200ML-% IV SOLN
INTRAVENOUS | Status: AC
  Filled 2014-08-22: qty 200

## 2014-08-22 MED ORDER — HYDROMORPHONE HCL 1 MG/ML IJ SOLN
1.0000 mg | Freq: Once | INTRAMUSCULAR | Status: AC
  Administered 2014-08-22: 1 mg via INTRAVENOUS
  Filled 2014-08-22: qty 1

## 2014-08-22 MED ORDER — ENSURE ENLIVE PO LIQD
237.0000 mL | Freq: Two times a day (BID) | ORAL | Status: DC
  Administered 2014-08-23: 237 mL via ORAL

## 2014-08-22 MED ORDER — PIPERACILLIN-TAZOBACTAM 3.375 G IVPB 30 MIN
3.3750 g | Freq: Once | INTRAVENOUS | Status: AC
  Administered 2014-08-22: 3.375 g via INTRAVENOUS
  Filled 2014-08-22: qty 50

## 2014-08-22 MED ORDER — VANCOMYCIN HCL IN DEXTROSE 1-5 GM/200ML-% IV SOLN
1000.0000 mg | Freq: Three times a day (TID) | INTRAVENOUS | Status: DC
  Administered 2014-08-23 (×2): 1000 mg via INTRAVENOUS
  Filled 2014-08-22 (×5): qty 200

## 2014-08-22 MED ORDER — IOHEXOL 350 MG/ML SOLN
100.0000 mL | Freq: Once | INTRAVENOUS | Status: AC | PRN
  Administered 2014-08-22: 100 mL via INTRAVENOUS

## 2014-08-22 MED ORDER — SODIUM CHLORIDE 0.9 % IV BOLUS (SEPSIS)
1000.0000 mL | Freq: Once | INTRAVENOUS | Status: AC
  Administered 2014-08-22: 1000 mL via INTRAVENOUS

## 2014-08-22 MED ORDER — PIPERACILLIN-TAZOBACTAM 3.375 G IVPB
3.3750 g | Freq: Three times a day (TID) | INTRAVENOUS | Status: DC
  Administered 2014-08-23 (×2): 3.375 g via INTRAVENOUS
  Filled 2014-08-22 (×5): qty 50

## 2014-08-22 MED ORDER — ONDANSETRON HCL 4 MG/2ML IJ SOLN
4.0000 mg | Freq: Four times a day (QID) | INTRAMUSCULAR | Status: DC | PRN
  Administered 2014-08-22 – 2014-08-24 (×5): 4 mg via INTRAVENOUS
  Filled 2014-08-22 (×5): qty 2

## 2014-08-22 MED ORDER — LORAZEPAM 1 MG PO TABS
1.0000 mg | ORAL_TABLET | Freq: Three times a day (TID) | ORAL | Status: DC | PRN
Start: 2014-08-22 — End: 2014-08-25
  Administered 2014-08-22 – 2014-08-25 (×7): 1 mg via ORAL
  Filled 2014-08-22 (×7): qty 1

## 2014-08-22 MED ORDER — SODIUM CHLORIDE 0.9 % IV SOLN
Freq: Once | INTRAVENOUS | Status: AC
  Administered 2014-08-22: 18:00:00 via INTRAVENOUS

## 2014-08-22 MED ORDER — MORPHINE SULFATE 4 MG/ML IJ SOLN
4.0000 mg | INTRAMUSCULAR | Status: DC | PRN
  Administered 2014-08-22 – 2014-08-23 (×2): 4 mg via INTRAVENOUS
  Filled 2014-08-22 (×2): qty 1

## 2014-08-22 MED ORDER — OXYCODONE HCL 5 MG PO TABS
5.0000 mg | ORAL_TABLET | Freq: Three times a day (TID) | ORAL | Status: DC
  Administered 2014-08-22 – 2014-08-23 (×4): 5 mg via ORAL
  Filled 2014-08-22 (×4): qty 1

## 2014-08-22 MED ORDER — CHLORHEXIDINE GLUCONATE 0.12 % MT SOLN
15.0000 mL | Freq: Two times a day (BID) | OROMUCOSAL | Status: DC
  Administered 2014-08-23 (×2): 15 mL via OROMUCOSAL
  Filled 2014-08-22 (×2): qty 15

## 2014-08-22 MED ORDER — VANCOMYCIN HCL IN DEXTROSE 1-5 GM/200ML-% IV SOLN
1000.0000 mg | Freq: Once | INTRAVENOUS | Status: AC
  Administered 2014-08-22: 1000 mg via INTRAVENOUS
  Filled 2014-08-22: qty 200

## 2014-08-22 MED ORDER — HYDROCORTISONE 1 % EX CREA: TOPICAL_CREAM | Freq: Two times a day (BID) | CUTANEOUS | Status: DC | PRN

## 2014-08-22 MED ORDER — ONDANSETRON HCL 4 MG PO TABS: 4.0000 mg | ORAL_TABLET | Freq: Four times a day (QID) | ORAL | Status: DC | PRN

## 2014-08-22 NOTE — Progress Notes (Addendum)
ANTIBIOTIC CONSULT NOTE  Pharmacy Consult for Vancomycin & Zosyn Indication: pneumonia  No Known Allergies  Patient Measurements: Height: '5\' 8"'$  (172.7 cm) Weight: 155 lb (70.308 kg) IBW/kg (Calculated) : 68.4  Vital Signs: Temp: 97.3 F (36.3 C) (05/08 2001) Temp Source: Oral (05/08 2001) BP: 116/74 mmHg (05/08 2001) Pulse Rate: 88 (05/08 2001) Intake/Output from previous day:   Intake/Output from this shift:    Labs:  Recent Labs  08/22/14 1638  WBC 17.6*  HGB 15.8  PLT 427*  CREATININE 0.57*   Estimated Creatinine Clearance: 106.9 mL/min (by C-G formula based on Cr of 0.57). No results for input(s): VANCOTROUGH, VANCOPEAK, VANCORANDOM, GENTTROUGH, GENTPEAK, GENTRANDOM, TOBRATROUGH, TOBRAPEAK, TOBRARND, AMIKACINPEAK, AMIKACINTROU, AMIKACIN in the last 72 hours.   Microbiology: Recent Results (from the past 720 hour(s))  Blood culture (routine x 2)     Status: None   Collection Time: 08/06/14  5:40 PM  Result Value Ref Range Status   Specimen Description BLOOD LEFT ARM  Final   Special Requests BOTTLES DRAWN AEROBIC AND ANAEROBIC 6CC  Final   Culture NO GROWTH 5 DAYS  Final   Report Status 08/11/2014 FINAL  Final  Blood culture (routine x 2)     Status: None   Collection Time: 08/06/14  5:45 PM  Result Value Ref Range Status   Specimen Description BLOOD LEFT HAND  Final   Special Requests BOTTLES DRAWN AEROBIC AND ANAEROBIC 6CC  Final   Culture NO GROWTH 5 DAYS  Final   Report Status 08/11/2014 FINAL  Final  Body fluid culture     Status: None   Collection Time: 08/09/14  2:30 PM  Result Value Ref Range Status   Specimen Description FLUID LEFT PLEURAL  Final   Special Requests NONE  Final   Gram Stain   Final    RARE WBC PRESENT, PREDOMINANTLY MONONUCLEAR NO ORGANISMS SEEN Performed at [facility]    Culture   Final    NO GROWTH 3 DAYS Performed at [facility]    Report Status 08/13/2014 FINAL  Final  Blood culture (routine x 2)      Status: None (Preliminary result)   Collection Time: 08/22/14  5:51 PM  Result Value Ref Range Status   Specimen Description BLOOD RIGHT ARM  Final   Special Requests BOTTLES DRAWN AEROBIC AND ANAEROBIC [place]  Final   Culture PENDING  Incomplete   Report Status PENDING  Incomplete  Blood culture (routine x 2)     Status: None (Preliminary result)   Collection Time: 08/22/14  5:53 PM  Result Value Ref Range Status   Specimen Description LEFT ANTECUBITAL  Final   Special Requests BOTTLES DRAWN AEROBIC AND ANAEROBIC [place]  Final   Culture PENDING  Incomplete   Report Status PENDING  Incomplete    Anti-infectives    Start     Dose/Rate Route Frequency Ordered Stop   08/22/14 1745  piperacillin-tazobactam (ZOSYN) IVPB 3.375 g     3.375 g 100 mL/hr over 30 Minutes Intravenous  Once 08/22/14 1730 08/22/14 1928   08/22/14 1745  vancomycin (VANCOCIN) IVPB 1000 mg/200 mL premix     1,000 mg 200 mL/hr over 60 Minutes Intravenous  Once 08/22/14 1730 08/22/14 1928      Assessment: 49 yo M with recently diagnosed metastatic adenocarcinoma of the lung presents with increasing dyspnea and left-sided chest pain.  Chest CT + pleural effusion.   He is afebrile.  Empiric, broad-spectrum antibiotics are being started for leukocytosis.  Patient is also on steroid therapy which could also explain elevated WBC.   Renal function is at patient's baseline.   Goal of Therapy:  Vancomycin trough level 15-20 mcg/ml  Plan:  Zosyn 3.375gm IV Q8h to be infused over 4hrs Vancomycin 1gm IV q8h Check Vancomycin trough at steady state Monitor renal function and cx data  Duration of therapy per MD- de-escalate or d/c antibiotics once appropriate.   Biagio Borg 08/22/2014,9:00 PM

## 2014-08-22 NOTE — ED Notes (Signed)
Pt c/o chest pain that started this morning, radiates to shoulder and back; recent dx of cancer; recent pnemonia

## 2014-08-22 NOTE — H&P (Signed)
[facility] History and Physical  [name] QPY:[id number] DOB: [birth year] DOA: 08/22/2014  Referring physician: ER. PCP: No PCP Per Patient   Chief Complaint: Left-sided chest pain. Dyspnea.  HPI: [name] is a 49 y.o. 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 He was due to undergo radiotherapy tomorrow. However, in the last few days he has become worse with increasing dyspnea and left-sided chest pain. He has had no fever and no significant cough.   Review of Systems:  Apart from symptoms above, all systems negative.  Past Medical History  Diagnosis Date  . Primary lung adenocarcinoma 08/17/2014   Past Surgical History  Procedure Laterality Date  . Appendectomy     Social History:  reports that he quit smoking about 4 months ago. His smoking use included Cigarettes. He has a 10 pack-year smoking history. He has never used smokeless tobacco. He reports that he uses illicit drugs (Marijuana). He reports that he does not drink alcohol.  No Known Allergies  Family History  Problem Relation Age of Onset  . Diabetes Mother       Prior to Admission medications   Medication Sig Start Date End Date Taking? Authorizing Provider  dexamethasone (DECADRON) 4 MG tablet Take 2 tablets (8 mg total) by mouth 3 (three) times daily. 08/18/14   Erline Hau, MD  fentaNYL (DURAGESIC - DOSED MCG/HR) 25 MCG/HR patch Place 1 patch (25 mcg total) onto the skin every 3 (three) days. 08/18/14   Erline Hau, MD  hydrocortisone cream 1 % Apply topically 2 (two) times daily as needed for itching. 08/10/14   Maryann Mikhail, DO  LORazepam (ATIVAN) 1 MG tablet Take 1 tablet (1 mg total) by mouth 3 (three) times daily as needed for anxiety. Patient not taking: Reported on 08/17/2014 08/14/14   Lily Kocher, PA-C  oxyCODONE (OXY IR/ROXICODONE) 5 MG immediate release tablet  Take 1 tablet (5 mg total) by mouth 4 (four) times daily - after meals and at bedtime. 08/18/14   Erline Hau, MD   Physical Exam: Filed Vitals:   08/22/14 1800 08/22/14 1810 08/22/14 1815 08/22/14 1900  BP:  119/94  116/90  Pulse:  90  82  Temp:   98.8 F (37.1 C)   TempSrc:   Rectal   Resp: '13 16  12  '$ Height:      Weight:      SpO2:  100%  94%    Wt Readings from Last 3 Encounters:  08/22/14 70.308 kg (155 lb)  08/18/14 70.67 kg (155 lb 12.8 oz)  08/18/14 70.308 kg (155 lb)    General:  Appears uncomfortable at rest. He is cachectic. Eyes: PERRL, normal lids, irises & conjunctiva ENT: grossly normal hearing, lips & tongue Neck: no LAD, masses or thyromegaly Cardiovascular: RRR, no m/r/g. No LE edema. Telemetry: SR, no arrhythmias  Respiratory: Reduced air entry on the left side with dullness to percussion. There is no bronchial breathing, crackles or wheezing. Abdomen: soft, ntnd Skin: no rash or induration seen on limited exam Musculoskeletal: grossly normal tone BUE/BLE Psychiatric: grossly normal mood and affect, speech fluent and appropriate Neurologic: grossly non-focal.          Labs on Admission:  Basic Metabolic Panel:  Recent Labs Lab 08/17/14 0301 08/18/14 0652 08/22/14 1638  NA 138 137 138  K 4.1 4.6 3.8  CL 101 102 103  CO2 '25 24 25  '$ GLUCOSE 88 125* 113*  BUN '16 16 16  '$ CREATININE 0.75 0.58* 0.57*  CALCIUM 9.0 9.1 8.2*   Liver Function Tests:  Recent Labs Lab 08/17/14 0301 08/18/14 0652  AST 13* 11*  ALT 9* 8*  ALKPHOS 72 61  BILITOT 0.9 0.7  PROT 7.6 7.0  ALBUMIN 3.2* 2.8*    Recent Labs Lab 08/17/14 0301  LIPASE 16*   No results for input(s): AMMONIA in the last 168 hours. CBC:  Recent Labs Lab 08/17/14 0301 08/18/14 0652 08/22/14 1638  WBC 11.0* 9.0 17.6*  NEUTROABS 8.0*  --  15.2*  HGB 14.6 13.4 15.8  HCT 42.4 39.5 45.5  MCV 93.4 93.4 92.9  PLT 353 417* 427*   Cardiac Enzymes:  Recent Labs Lab  08/17/14 0301 08/22/14 1638  TROPONINI <0.03 <0.03    BNP (last 3 results)  Recent Labs  08/06/14 1609  BNP 16.0    ProBNP (last 3 results) No results for input(s): PROBNP in the last 8760 hours.  CBG: No results for input(s): GLUCAP in the last 168 hours.  Radiological Exams on Admission: Ct Angio Chest Pe W/cm &/or Wo Cm  08/22/2014   CLINICAL DATA:  Chest pain gain this morning radiating to shoulders and back, recent diagnosis of lung cancer with malignant pleural effusion and osseous metastases, recent pneumonia, former smoker  EXAM: CT ANGIOGRAPHY CHEST WITH CONTRAST  TECHNIQUE: Multidetector CT imaging of the chest was performed using the standard protocol during bolus administration of intravenous contrast. Multiplanar CT image reconstructions and MIPs were obtained to evaluate the vascular anatomy.  CONTRAST:  124m OMNIPAQUE IOHEXOL 350 MG/ML SOLN IV  COMPARISON:  08/11/2014  FINDINGS: Aorta normal caliber without aneurysm or dissection.  Respiratory motion artifacts in the lower lobes, mild.  Pulmonary arteries well opacified and patent.  No evidence of pulmonary embolism.  Enlarged inferior RIGHT hilar node 14 mm short axis image 45 unchanged.  Enlarged AP window lymph node 15 mm short axis image 34 unchanged.  Partially loculated LEFT pleural effusion little changed.  Dependent atelectasis RIGHT lower lobe.  Mild compressive atelectasis of the posterior aspect LEFT upper lobe.  Complete atelectasis of LEFT lower lobe with again identified LEFT perihilar mass measuring 3.6 x 3.0 cm image 41 unchanged.  This causes compression of the pulmonary arteries and the LEFT upper lobe bronchus.  No additional infiltrate, mass, or nodule.  No pneumothorax.  Perihepatic ascites seen in RIGHT upper quadrant.  Bone destruction of T3 vertebral body and posterior LEFT 8th and 10th ribs consistent with lytic metastases.  Review of the MIP images confirms the above findings.  IMPRESSION: No evidence of  pulmonary embolism.  Partially loculated moderate-sized LEFT pleural effusion with LEFT lower lobe atelectasis.  LEFT perihilar mass 3.6 x 3.0 cm unchanged.  Thoracic adenopathy and osseous metastases again identified.  Perihepatic ascites.   Electronically Signed   By: MLavonia DanaM.D.   On: 08/22/2014 17:54   Dg Chest Port 1 View  08/22/2014   CLINICAL DATA:  Shortness of breath and chest pain, history of lung cancer  EXAM: PORTABLE CHEST - 1 VIEW  COMPARISON:  08/11/2014, 08/17/2014  FINDINGS: Enlarging left-sided pleural effusion is again noted. Persistent left hilar mass lesion is seen. The right lung remains clear. The cardiac shadow is stable. No bony abnormality is seen.  IMPRESSION: Increasing left-sided pleural effusion.   Electronically Signed   By: MInez CatalinaM.D.   On: 08/22/2014 17:09  Assessment/Plan   1. Recurrent left-sided pleural effusion. I'm sure this is malignant and we will ask for thoracentesis. Hopefully, he can still undergo radiotherapy soon. He may have underlying pneumonia with a rise in his white count and I will cover him with broad-spectrum antibiotics also. 2. Left-sided chest pain. This is probably coming from his pleural effusion and progression of malignancy. Will give him appropriate analgesia. 3. Hypertension. Stable. 4. Adenocarcinoma, stage IV, presumed lung primary. I will ask oncology consultation regarding further treatment.  Further recommendations will depend on patient's hospital progress.   Code Status: Full code.   DVT Prophylaxis: SCDs.  Family Communication: I discussed the plan with the patient at the bedside.   Disposition Plan: Depending on progress.   Time spent: 60 minutes.  Doree Albee [facility] Pager [phone].

## 2014-08-22 NOTE — ED Provider Notes (Signed)
CSN: [id number]     Arrival date & time 08/22/14  1604 History   First MD Initiated Contact with Patient 08/22/14 1613     Chief Complaint  Patient presents with  . Chest Pain     (Consider location/radiation/quality/duration/timing/severity/associated sxs/prior Treatment) HPI Comments: 49 year old male with history of lung cancer and metastasis, malignant pleural effusion, tobacco abuse, high blood pressure presents with worsening left chest pain shortness of breath. Patient's had this in the past but is more severe. No blood clot history however patient does have lung cancer. Symptoms worsened this morning, no cardiac history known. Pain is constant severe ache, worse with breathing.  Patient is a 49 y.o. male presenting with chest pain. The history is provided by the patient and medical records.  Chest Pain Associated symptoms: nausea, shortness of breath and vomiting   Associated symptoms: no abdominal pain, no back pain, no fever and no headache     Past Medical History  Diagnosis Date  . Primary lung adenocarcinoma 08/17/2014   Past Surgical History  Procedure Laterality Date  . Appendectomy     Family History  Problem Relation Age of Onset  . Diabetes Mother    History  Substance Use Topics  . Smoking status: Former Smoker -- 0.50 packs/day for 20 years    Types: Cigarettes    Quit date: 04/16/2014  . Smokeless tobacco: Never Used  . Alcohol Use: No     Comment: occ    Review of Systems  Constitutional: Positive for appetite change. Negative for fever and chills.  HENT: Negative for congestion.   Eyes: Negative for visual disturbance.  Respiratory: Positive for shortness of breath.   Cardiovascular: Positive for chest pain. Negative for leg swelling.  Gastrointestinal: Positive for nausea and vomiting. Negative for abdominal pain.  Genitourinary: Negative for dysuria and flank pain.  Musculoskeletal: Negative for back pain, neck pain and neck stiffness.  Skin:  Negative for rash.  Neurological: Negative for light-headedness and headaches.      Allergies  Review of patient's allergies indicates no known allergies.  Home Medications   Prior to Admission medications   Medication Sig Start Date End Date Taking? Authorizing Provider  dexamethasone (DECADRON) 4 MG tablet Take 2 tablets (8 mg total) by mouth 3 (three) times daily. 08/18/14   Erline Hau, MD  fentaNYL (DURAGESIC - DOSED MCG/HR) 25 MCG/HR patch Place 1 patch (25 mcg total) onto the skin every 3 (three) days. 08/18/14   Erline Hau, MD  hydrocortisone cream 1 % Apply topically 2 (two) times daily as needed for itching. 08/10/14   Maryann Mikhail, DO  LORazepam (ATIVAN) 1 MG tablet Take 1 tablet (1 mg total) by mouth 3 (three) times daily as needed for anxiety. Patient not taking: Reported on 08/17/2014 08/14/14   Lily Kocher, PA-C  oxyCODONE (OXY IR/ROXICODONE) 5 MG immediate release tablet Take 1 tablet (5 mg total) by mouth 4 (four) times daily - after meals and at bedtime. 08/18/14   Erline Hau, MD   BP 119/94 mmHg  Pulse 90  Temp(Src) 98.8 F (37.1 C) (Rectal)  Resp 16  Ht '5\' 8"'$  (1.727 m)  Wt 155 lb (70.308 kg)  BMI 23.57 kg/m2  SpO2 100% Physical Exam  Constitutional: He is oriented to person, place, and time. He appears well-developed. No distress.  HENT:  Head: Normocephalic and atraumatic.  Significant dry mucous membranes  Eyes: Right eye exhibits no discharge. Left eye exhibits no discharge.  Neck:  Normal range of motion. Neck supple. No tracheal deviation present.  Cardiovascular: Regular rhythm.  Tachycardia present.   Pulmonary/Chest: Respiratory distress: increased respiratory effort. He has rales (minimal breath sounds left lower and mid lung with a few rales).  Abdominal: Soft. He exhibits no distension. There is no tenderness. There is no guarding.  Musculoskeletal: He exhibits no edema.  Neurological: He is alert and  oriented to person, place, and time. No cranial nerve deficit.  Skin: Skin is warm. No rash noted.  Psychiatric:  Mild lethargy/fatigue appearance  Nursing note and vitals reviewed.   ED Course  Procedures (including critical care time) Labs Review Labs Reviewed  BASIC METABOLIC PANEL - Abnormal; Notable for the following:    Glucose, Bld 113 (*)    Creatinine, Ser 0.57 (*)    Calcium 8.2 (*)    All other components within normal limits  CBC WITH DIFFERENTIAL/PLATELET - Abnormal; Notable for the following:    WBC 17.6 (*)    Platelets 427 (*)    Neutrophils Relative % 86 (*)    Neutro Abs 15.2 (*)    Lymphocytes Relative 5 (*)    All other components within normal limits  CULTURE, BLOOD (ROUTINE X 2)  CULTURE, BLOOD (ROUTINE X 2)  TROPONIN I  LACTIC ACID, PLASMA  LACTIC ACID, PLASMA    Imaging Review Ct Angio Chest Pe W/cm &/or Wo Cm  08/22/2014   CLINICAL DATA:  Chest pain gain this morning radiating to shoulders and back, recent diagnosis of lung cancer with malignant pleural effusion and osseous metastases, recent pneumonia, former smoker  EXAM: CT ANGIOGRAPHY CHEST WITH CONTRAST  TECHNIQUE: Multidetector CT imaging of the chest was performed using the standard protocol during bolus administration of intravenous contrast. Multiplanar CT image reconstructions and MIPs were obtained to evaluate the vascular anatomy.  CONTRAST:  160m OMNIPAQUE IOHEXOL 350 MG/ML SOLN IV  COMPARISON:  08/11/2014  FINDINGS: Aorta normal caliber without aneurysm or dissection.  Respiratory motion artifacts in the lower lobes, mild.  Pulmonary arteries well opacified and patent.  No evidence of pulmonary embolism.  Enlarged inferior RIGHT hilar node 14 mm short axis image 45 unchanged.  Enlarged AP window lymph node 15 mm short axis image 34 unchanged.  Partially loculated LEFT pleural effusion little changed.  Dependent atelectasis RIGHT lower lobe.  Mild compressive atelectasis of the posterior aspect  LEFT upper lobe.  Complete atelectasis of LEFT lower lobe with again identified LEFT perihilar mass measuring 3.6 x 3.0 cm image 41 unchanged.  This causes compression of the pulmonary arteries and the LEFT upper lobe bronchus.  No additional infiltrate, mass, or nodule.  No pneumothorax.  Perihepatic ascites seen in RIGHT upper quadrant.  Bone destruction of T3 vertebral body and posterior LEFT 8th and 10th ribs consistent with lytic metastases.  Review of the MIP images confirms the above findings.  IMPRESSION: No evidence of pulmonary embolism.  Partially loculated moderate-sized LEFT pleural effusion with LEFT lower lobe atelectasis.  LEFT perihilar mass 3.6 x 3.0 cm unchanged.  Thoracic adenopathy and osseous metastases again identified.  Perihepatic ascites.   Electronically Signed   By: MLavonia DanaM.D.   On: 08/22/2014 17:54   Dg Chest Port 1 View  08/22/2014   CLINICAL DATA:  Shortness of breath and chest pain, history of lung cancer  EXAM: PORTABLE CHEST - 1 VIEW  COMPARISON:  08/11/2014, 08/17/2014  FINDINGS: Enlarging left-sided pleural effusion is again noted. Persistent left hilar mass lesion is seen. The right  lung remains clear. The cardiac shadow is stable. No bony abnormality is seen.  IMPRESSION: Increasing left-sided pleural effusion.   Electronically Signed   By: Inez Catalina M.D.   On: 08/22/2014 17:09     EKG Interpretation   Date/Time:  Sunday Aug 22 2014 16:14:52 EDT Ventricular Rate:  107 PR Interval:  118 QRS Duration: 84 QT Interval:  350 QTC Calculation: 467 R Axis:   49 Text Interpretation:  Sinus tachycardia Borderline T abnormalities,  lateral leads Confirmed by Raivyn Kabler  MD, Bryella Diviney ([id number]) on 08/22/2014 5:27:19  PM      MDM   Final diagnoses:  Acute dyspnea  Primary lung adenocarcinoma, left  Recurrent left pleural effusion  Left sided chest pain   Patient with significant lung cancer and effusion history presents with worsening left sided chest pain radiating  to the back and shortness of breath. Clinical concern for worsening pleural effusion and cancer related pain. Also on the differential blood clot, CT angiogram of the chest ordered, pain meds, IV fluids, cardiac screen.  Chest x-ray reviewed worsening pleural effusion, likely malignant however with recent hospitalization, white blood cell count elevation and worsening symptoms plan for antibiotics and CT scan for further detail.   The patients results and plan were reviewed and discussed.   Any x-rays performed were personally reviewed by myself.   Differential diagnosis were considered with the presenting HPI.  Medications  piperacillin-tazobactam (ZOSYN) IVPB 3.375 g (3.375 g Intravenous New Bag/Given 08/22/14 1812)  vancomycin (VANCOCIN) IVPB 1000 mg/200 mL premix (1,000 mg Intravenous New Bag/Given 08/22/14 1812)  HYDROmorphone (DILAUDID) injection 1 mg (not administered)  0.9 %  sodium chloride infusion (not administered)  HYDROmorphone (DILAUDID) injection 1 mg (1 mg Intravenous Given 08/22/14 1650)  sodium chloride 0.9 % bolus 1,000 mL (0 mLs Intravenous Stopped 08/22/14 1727)  iohexol (OMNIPAQUE) 350 MG/ML injection 100 mL (100 mLs Intravenous Contrast Given 08/22/14 1736)  0.9 %  sodium chloride infusion ( Intravenous New Bag/Given 08/22/14 1819)    Filed Vitals:   08/22/14 1730 08/22/14 1800 08/22/14 1810 08/22/14 1815  BP: 119/102  119/94   Pulse:   90   Temp:    98.8 F (37.1 C)  TempSrc:    Rectal  Resp:  13 16   Height:      Weight:      SpO2:   100%     Final diagnoses:  Acute dyspnea  Primary lung adenocarcinoma, left  Recurrent left pleural effusion  Left sided chest pain    Admission/ observation were discussed with the admitting physician, patient and/or family and they are comfortable with the plan.    Elnora Morrison, MD 08/22/14 [phone]

## 2014-08-23 ENCOUNTER — Ambulatory Visit (HOSPITAL_COMMUNITY): Payer: MEDICAID

## 2014-08-23 ENCOUNTER — Inpatient Hospital Stay (HOSPITAL_COMMUNITY): Payer: Medicaid Other

## 2014-08-23 ENCOUNTER — Other Ambulatory Visit (HOSPITAL_COMMUNITY): Payer: Self-pay | Admitting: Oncology

## 2014-08-23 ENCOUNTER — Encounter (HOSPITAL_COMMUNITY): Payer: Self-pay

## 2014-08-23 DIAGNOSIS — C3492 Malignant neoplasm of unspecified part of left bronchus or lung: Secondary | ICD-10-CM

## 2014-08-23 DIAGNOSIS — C799 Secondary malignant neoplasm of unspecified site: Secondary | ICD-10-CM

## 2014-08-23 DIAGNOSIS — J910 Malignant pleural effusion: Secondary | ICD-10-CM

## 2014-08-23 DIAGNOSIS — R0600 Dyspnea, unspecified: Secondary | ICD-10-CM

## 2014-08-23 DIAGNOSIS — C3490 Malignant neoplasm of unspecified part of unspecified bronchus or lung: Secondary | ICD-10-CM

## 2014-08-23 LAB — COMPREHENSIVE METABOLIC PANEL
ALT: 10 U/L — AB (ref 17–63)
AST: 13 U/L — AB (ref 15–41)
Albumin: 3 g/dL — ABNORMAL LOW (ref 3.5–5.0)
Alkaline Phosphatase: 67 U/L (ref 38–126)
Anion gap: 10 (ref 5–15)
BUN: 14 mg/dL (ref 6–20)
CHLORIDE: 102 mmol/L (ref 101–111)
CO2: 24 mmol/L (ref 22–32)
Calcium: 7.7 mg/dL — ABNORMAL LOW (ref 8.9–10.3)
Creatinine, Ser: 0.52 mg/dL — ABNORMAL LOW (ref 0.61–1.24)
GFR calc Af Amer: >60 mL/min (ref >60)
Glucose, Bld: 104 mg/dL — ABNORMAL HIGH (ref 70–99)
POTASSIUM: 4.1 mmol/L (ref 3.5–5.1)
SODIUM: 136 mmol/L (ref 135–145)
Total Bilirubin: 0.6 mg/dL (ref 0.3–1.2)
Total Protein: 7.1 g/dL (ref 6.5–8.1)

## 2014-08-23 LAB — CBC
HEMATOCRIT: 41.3 % (ref 39.0–52.0)
Hemoglobin: 14 g/dL (ref 13.0–17.0)
MCH: 31.5 pg (ref 26.0–34.0)
MCHC: 33.9 g/dL (ref 30.0–36.0)
MCV: 92.8 fL (ref 78.0–100.0)
PLATELETS: 389 10*3/uL (ref 150–400)
RBC: 4.45 MIL/uL (ref 4.22–5.81)
RDW: 13 % (ref 11.5–15.5)
WBC: 18.4 10*3/uL — ABNORMAL HIGH (ref 4.0–10.5)

## 2014-08-23 MED ORDER — MORPHINE SULFATE 2 MG/ML IJ SOLN
2.0000 mg | INTRAMUSCULAR | Status: DC | PRN
  Administered 2014-08-23 – 2014-08-24 (×9): 2 mg via INTRAVENOUS
  Filled 2014-08-23 (×9): qty 1

## 2014-08-23 MED ORDER — ENSURE ENLIVE PO LIQD: 237.0000 mL | Freq: Two times a day (BID) | ORAL | Status: DC

## 2014-08-23 MED ORDER — FENTANYL 50 MCG/HR TD PT72: 50.0000 ug | MEDICATED_PATCH | TRANSDERMAL | Status: DC

## 2014-08-23 MED ORDER — FENTANYL 25 MCG/HR TD PT72
25.0000 ug | MEDICATED_PATCH | TRANSDERMAL | Status: DC
  Administered 2014-08-23: 25 ug via TRANSDERMAL
  Filled 2014-08-23: qty 1

## 2014-08-23 MED ORDER — MORPHINE SULFATE 2 MG/ML IJ SOLN
2.0000 mg | INTRAMUSCULAR | Status: AC
  Administered 2014-08-23: 2 mg via INTRAVENOUS
  Filled 2014-08-23: qty 1

## 2014-08-23 MED ORDER — ENSURE ENLIVE PO LIQD
237.0000 mL | Freq: Three times a day (TID) | ORAL | Status: DC
  Administered 2014-08-23 – 2014-08-25 (×4): 237 mL via ORAL

## 2014-08-23 NOTE — Procedures (Signed)
PreOperative Dx: LEFT pleural effusion Postoperative Dx: LEFT pleural effusion Procedure:   US guided LEFT thoracentesis Radiologist:  Thornton Papas Anesthesia:  8 ml of 1 lidocaine Specimen:  700 ml of bloody colored fluid EBL:   < 1 ml Complications: None

## 2014-08-23 NOTE — Consult Note (Signed)
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  HISTORY OF PRESENT ILLNESS:   Mr. Strohmeier is a 49 year old black American who is known to the [facility] where he has been work-up for metastatic adenocarcinoma, presumed lung primary, with referral to radiation for palliative radiation beginning on Thursday 5/5.  Chart is reviewed.   I personally reviewed and went over laboratory results with the patient.  The results are noted within this dictation.  I personally reviewed and went over radiographic studies with the patient.  The results are noted within this dictation.    The patient sister and brother-in-law were present.  I discussed code status with the patient and he was very clear that he did not want life support, including intubation, chest compression, medications, and defibrillation.  Mr. Reser made his wishes very clear to me in the presence of his family.  His brother-in-law did not seem entirely pleased with this decision.  "But you're telling me that life support could be done and save him."  I redirected the brother-in-law that yes, these interventions are available, but rarely effective.  Additionally, if they were effective, we would bring Mr. Basnett to a life with incurable malignancy, prolonging his suffering.  DNR status established today.  I broached the topic of Hospice.  I discussed goals of care.  "I want the easiest way out."  I asked him to define that for me or what that statement means to him.  "I don't want to suffer."  He notes back pain that is not well controlled with current pain regimen.  I will address his pain medication.  "So you're just gonna give him medication to make him sleep?"  said the brother-in-law.  I explained that we will balance the risks and benefits of pain medication.  One of the side effects is drowsiness with pain medication and yes, he may sleep some, but he will not be in pain.  The patient lives with his mother who is a patient of ours, Rich Fuchs, and her health is not the best.  We discussed Hospice and I predicted a weeks to 1-3 months of life expected.  The family was not completely on board with Hospice, but I at least got them thinking in those terms.  Her would benefit the most from Hospice.  He is not a systemic chemotherapy candidate.    For now we will focus on pain control and re-address Hospice again tomorrow.  I got the sense that the patient's brother-in-law is shocked by the news and he is showing some resistance to comfort care at this time.    PAST MEDICAL HISTORY:   Past Medical History  Diagnosis Date  . Primary lung adenocarcinoma 08/17/2014    ALLERGIES: No Known Allergies    MEDICATIONS: I have reviewed the patient's current medications.     PAST SURGICAL HISTORY Past Surgical History  Procedure Laterality Date  . Appendectomy      FAMILY HISTORY: Family History  Problem Relation Age of Onset  . Diabetes Mother     SOCIAL HISTORY:  reports that he quit smoking about 4 months ago. His smoking use included Cigarettes. He has a 10 pack-year smoking history. He has  never used smokeless tobacco. He reports that he uses illicit drugs (Marijuana). He reports that he does not drink alcohol.  PERFORMANCE STATUS: The patient's performance status is 3 - Symptomatic, >50% confined to bed  PHYSICAL EXAM: Most Recent Vital Signs: Blood pressure 128/91, pulse 101, temperature 98.3 F (36.8 C), temperature source Oral, resp. rate 20, height $RemoveBe'5\' 8"'[id number]$  (1.727 m), weight 158 lb 4.6 oz (71.8 kg), SpO2 97 %. General appearance: alert, cooperative and mild distress Head: Normocephalic, without obvious abnormality, atraumatic Eyes: positive  findings: eyes not tracking appropriately. Lungs: diminished breath sounds anterior - left Heart: regular rate and rhythm Abdomen: soft, non-tender; bowel sounds normal; no masses,  no organomegaly Extremities: extremities normal, atraumatic, no cyanosis or edema Skin: Skin color, texture, turgor normal. No rashes or lesions Neurologic: Grossly normal  LABORATORY DATA:  Results for orders placed or performed during the hospital encounter of 08/22/14 (from the past 48 hour(s))  Basic metabolic panel     Status: Abnormal   Collection Time: 08/22/14  4:38 PM  Result Value Ref Range   Sodium 138 135 - 145 mmol/L   Potassium 3.8 3.5 - 5.1 mmol/L   Chloride 103 101 - 111 mmol/L   CO2 25 22 - 32 mmol/L   Glucose, Bld 113 (H) 70 - 99 mg/dL   BUN 16 6 - 20 mg/dL   Creatinine, Ser 0.57 (L) 0.61 - 1.24 mg/dL   Calcium 8.2 (L) 8.9 - 10.3 mg/dL   GFR calc non Af Amer >60 >60 mL/min   GFR calc Af Amer >60 >60 mL/min    Comment: (NOTE) The eGFR has been calculated using the CKD EPI equation. This calculation has not been validated in all clinical situations. eGFR's persistently <60 mL/min signify possible Chronic Kidney Disease.    Anion gap 10 5 - 15  CBC with Differential/Platelet     Status: Abnormal   Collection Time: 08/22/14  4:38 PM  Result Value Ref Range   WBC 17.6 (H) 4.0 - 10.5 K/uL   RBC 4.90 4.22 - 5.81 MIL/uL   Hemoglobin 15.8 13.0 - 17.0 g/dL   HCT 45.5 39.0 - 52.0 %   MCV 92.9 78.0 - 100.0 fL   MCH 32.2 26.0 - 34.0 pg   MCHC 34.7 30.0 - 36.0 g/dL   RDW 12.9 11.5 - 15.5 %   Platelets 427 (H) 150 - 400 K/uL   Neutrophils Relative % 86 (H) 43 - 77 %   Neutro Abs 15.2 (H) 1.7 - 7.7 K/uL   Lymphocytes Relative 5 (L) 12 - 46 %   Lymphs Abs 0.9 0.7 - 4.0 K/uL   Monocytes Relative 6 3 - 12 %   Monocytes Absolute 1.0 0.1 - 1.0 K/uL   Eosinophils Relative 3 0 - 5 %   Eosinophils Absolute 0.4 0.0 - 0.7 K/uL   Basophils Relative 0 0 - 1 %   Basophils Absolute 0.0 0.0 - 0.1  K/uL  Troponin I     Status: None   Collection Time: 08/22/14  4:38 PM  Result Value Ref Range   Troponin I <0.03 <0.031 ng/mL    Comment:        NO INDICATION OF MYOCARDIAL INJURY.   Blood culture (routine x 2)     Status: None (Preliminary result)   Collection Time: 08/22/14  5:51 PM  Result Value Ref Range   Specimen Description BLOOD RIGHT ARM    Special Requests BOTTLES DRAWN AEROBIC AND ANAEROBIC [place]    Culture  NO GROWTH 1 DAY    Report Status PENDING   Lactic acid, plasma     Status: None   Collection Time: 08/22/14  5:51 PM  Result Value Ref Range   Lactic Acid, Venous 1.8 0.5 - 2.0 mmol/L  Blood culture (routine x 2)     Status: None (Preliminary result)   Collection Time: 08/22/14  5:53 PM  Result Value Ref Range   Specimen Description LEFT ANTECUBITAL    Special Requests BOTTLES DRAWN AEROBIC AND ANAEROBIC [place]    Culture NO GROWTH 1 DAY    Report Status PENDING   Lactic acid, plasma     Status: None   Collection Time: 08/22/14  6:54 PM  Result Value Ref Range   Lactic Acid, Venous 2.0 0.5 - 2.0 mmol/L    Comment: SLIGHT HEMOLYSIS  Comprehensive metabolic panel     Status: Abnormal   Collection Time: 08/23/14 10:35 AM  Result Value Ref Range   Sodium 136 135 - 145 mmol/L   Potassium 4.1 3.5 - 5.1 mmol/L   Chloride 102 101 - 111 mmol/L   CO2 24 22 - 32 mmol/L   Glucose, Bld 104 (H) 70 - 99 mg/dL   BUN 14 6 - 20 mg/dL   Creatinine, Ser 0.52 (L) 0.61 - 1.24 mg/dL   Calcium 7.7 (L) 8.9 - 10.3 mg/dL   Total Protein 7.1 6.5 - 8.1 g/dL   Albumin 3.0 (L) 3.5 - 5.0 g/dL   AST 13 (L) 15 - 41 U/L   ALT 10 (L) 17 - 63 U/L   Alkaline Phosphatase 67 38 - 126 U/L   Total Bilirubin 0.6 0.3 - 1.2 mg/dL   GFR calc non Af Amer >60 >60 mL/min   GFR calc Af Amer >60 >60 mL/min    Comment: (NOTE) The eGFR has been calculated using the CKD EPI equation. This calculation has not been validated in all clinical situations. eGFR's persistently <60 mL/min signify possible Chronic  Kidney Disease.    Anion gap 10 5 - 15  CBC     Status: Abnormal   Collection Time: 08/23/14 10:35 AM  Result Value Ref Range   WBC 18.4 (H) 4.0 - 10.5 K/uL   RBC 4.45 4.22 - 5.81 MIL/uL   Hemoglobin 14.0 13.0 - 17.0 g/dL   HCT 41.3 39.0 - 52.0 %   MCV 92.8 78.0 - 100.0 fL   MCH 31.5 26.0 - 34.0 pg   MCHC 33.9 30.0 - 36.0 g/dL   RDW 13.0 11.5 - 15.5 %   Platelets 389 150 - 400 K/uL      RADIOGRAPHY: Dg Chest 1 View  08/23/2014   CLINICAL DATA:  Post thoracentesis  EXAM: CHEST  1 VIEW  COMPARISON:  08/22/2014  FINDINGS: Cardiomediastinal silhouette is stable. Small residual left pleural effusion with left basilar atelectasis or infiltrate. No pulmonary edema. No pneumothorax. Right lung is clear.  IMPRESSION: Small residual left pleural effusion with left basilar atelectasis or infiltrate. No pneumothorax.   Electronically Signed   By: Lahoma Crocker M.D.   On: 08/23/2014 11:52   Ct Angio Chest Pe W/cm &/or Wo Cm  08/22/2014   CLINICAL DATA:  Chest pain gain this morning radiating to shoulders and back, recent diagnosis of lung cancer with malignant pleural effusion and osseous metastases, recent pneumonia, former smoker  EXAM: CT ANGIOGRAPHY CHEST WITH CONTRAST  TECHNIQUE: Multidetector CT imaging of the chest was performed using the standard protocol during bolus administration of intravenous contrast. Multiplanar  CT image reconstructions and MIPs were obtained to evaluate the vascular anatomy.  CONTRAST:  OMNIPAQUE IOHEXOL 350 MG/ML SOLN IV  COMPARISON:  08/11/2014  FINDINGS: Aorta normal caliber without aneurysm or dissection.  Respiratory motion artifacts in the lower lobes, mild.  Pulmonary arteries well opacified and patent.  No evidence of pulmonary embolism.  Enlarged inferior RIGHT hilar node 14 mm short axis image 45 unchanged.  Enlarged AP window lymph node 15 mm short axis image 34 unchanged.  Partially loculated LEFT pleural effusion little changed.  Dependent atelectasis RIGHT  lower lobe.  Mild compressive atelectasis of the posterior aspect LEFT upper lobe.  Complete atelectasis of LEFT lower lobe with again identified LEFT perihilar mass measuring 3.6 x 3.0 cm image 41 unchanged.  This causes compression of the pulmonary arteries and the LEFT upper lobe bronchus.  No additional infiltrate, mass, or nodule.  No pneumothorax.  Perihepatic ascites seen in RIGHT upper quadrant.  Bone destruction of T3 vertebral body and posterior LEFT 8th and 10th ribs consistent with lytic metastases.  Review of the MIP images confirms the above findings.  IMPRESSION: No evidence of pulmonary embolism.  Partially loculated moderate-sized LEFT pleural effusion with LEFT lower lobe atelectasis.  LEFT perihilar mass 3.6 x 3.0 cm unchanged.  Thoracic adenopathy and osseous metastases again identified.  Perihepatic ascites.   Electronically Signed   By: Ulyses Southward M.D.   On: 08/22/2014 17:54   Dg Chest Port 1 View  08/22/2014   CLINICAL DATA:  Shortness of breath and chest pain, history of lung cancer  EXAM: PORTABLE CHEST - 1 VIEW  COMPARISON:  08/11/2014, 08/17/2014  FINDINGS: Enlarging left-sided pleural effusion is again noted. Persistent left hilar mass lesion is seen. The right lung remains clear. The cardiac shadow is stable. No bony abnormality is seen.  IMPRESSION: Increasing left-sided pleural effusion.   Electronically Signed   By: Alcide Clever M.D.   On: 08/22/2014 17:09   US Thoracentesis Asp Pleural Space W/img Guide  08/23/2014   CLINICAL DATA:  Presumed lung cancer, LEFT pleural effusion  EXAM: ULTRASOUND GUIDED DIAGNOSTIC AND THERAPEUTIC LEFT THORACENTESIS  COMPARISON:  CT chest 08/22/2014  PROCEDURE: Procedure, benefits, and risks of procedure were discussed with patient.  Written informed consent for procedure was obtained.  Time out protocol followed.  Pleural effusion localized by ultrasound at the posterior LEFT hemithorax.  Skin prepped and draped in usual sterile fashion.  Skin and  soft tissues anesthetized with 8 mL of 1% lidocaine.  8 French thoracentesis catheter placed into the LEFT pleural space.  700 mL of bloody fluid aspirated by syringe pump.  Procedure tolerated well by patient without immediate complication.  COMPLICATIONS: None  FINDINGS: A total of approximately 700 ml of LEFT pleural fluid was removed. A fluid sample of 120 mL was sent for laboratory analysis.  IMPRESSION: Successful ultrasound guided LEFT thoracentesis yielding 700 mL of pleural fluid.   Electronically Signed   By: Ulyses Southward M.D.   On: 08/23/2014 12:44       PATHOLOGY:  Nothing new   ASSESSMENT/PLAN:  1. Stage IV adenocarcinoma, presumed lung primary radiographically.  Started palliative radiation on 5/5.  Plan was to get the patient through palliative radiation all the while discuss goals of care and then pursue comfort measures as he is not a candidate for systemic chemotherapy.  Now that he is re-hospitalized for chest pain and SOB, he would be best served with Hospice intervention.  This topic is broached.  For now, I will focus on pain control.  I will D/C Oxy IR.  Morphine 2 mg IV to be given now.  Then Morphine IV every 2 hours PRN pain 2 mg.  Increase Fentanyl to 50 mcg/hr.  DNR.  Will discuss Hospice further tomorrow.  I want to give the patient and family time to think about our discussion today.  I believe the patient is going to be agreeable to Hospice, the patient's brother-in-law may not be such a supporter of this intervention.  We will discuss more tomorrow with the patient.   All questions were answered. The patient knows to call the clinic with any problems, questions or concerns. We can certainly see the patient much sooner if necessary.  Patient and plan discussed with Dr. Ancil Linsey and she is in agreement with the aforementioned.   KEFALAS,THOMAS  08/23/2014 5:47 PM  Will continue to work with family on goals of care. Will agressively control patients pain, and treat  for comfort. Hospice is appropriate . He is not able to go to XRT, cannot tolerate intervention. Donald Pore MD

## 2014-08-23 NOTE — Progress Notes (Signed)
[facility] PROGRESS NOTE  [name] TML:[id number] DOB: Oct 25, 1965 DOA: 08/22/2014 PCP: No PCP Per Patient  Assessment/Plan: 1. Recurrent left-sided pleural effusion. Likely malignant. S/P thoracentesis today. Concern for underlying pneumonia given leukocytosis but this likely related to steroids. He is afebrile and non-toxic appearing. Will continue Vanc and zosyn day #2. Monitor closely  2. Left-sided chest pain. Generalized pain worse this am.  Related to his pleural effusion and progression of malignancy. Will resume home pain regimen as this was effective per family and patient. Resume fentanyl patch.  3. Hypertension. Stable. 4. Adenocarcinoma, stage IV, presumed lung primary. Await oncology consultation regarding further treatment. Last week palliative radiation recommended and it was scheduled to start today.    Code Status: DNR Family Communication: sister at bedside Disposition Plan: hospice home depending on oncology recommendations   Consultants:  Heme onc  Procedures:  Thoracentesis 08/23/14  Antibiotics:  Vancomycin 08/22/14>>  Zosyn 08/22/14>>  HPI/Subjective: Complaining of pain right shoulder, chest, low back and groin.   Objective: Filed Vitals:   08/23/14 1507  BP: 128/91  Pulse: 101  Temp: 98.3 F (36.8 C)  Resp: 20    Intake/Output Summary (Last 24 hours) at 08/23/14 1543 Last data filed at 08/23/14 1508  Gross per 24 hour  Intake    240 ml  Output    350 ml  Net   -110 ml   Filed Weights   08/22/14 1607 08/22/14 2208  Weight: 70.308 kg (155 lb) 71.8 kg (158 lb 4.6 oz)    Exam:   General:  Thin frail obviously uncomfortable  Cardiovascular: RRR no MGR No LE edema  Respiratory: mild increased work of breathing. BS diminished particularly on left. No wheeze  Abdomen: soft +BS non-tender to palpation  Musculoskeletal: no clubbing or cyanosis   Data Reviewed: Basic Metabolic Panel:  Recent Labs Lab 08/17/14 0301  08/18/14 0652 08/22/14 1638 08/23/14 1035  NA 138 137 138 136  K 4.1 4.6 3.8 4.1  CL 101 102 103 102  CO2 '25 24 25 24  '$ GLUCOSE 88 125* 113* 104*  BUN '16 16 16 14  '$ CREATININE 0.75 0.58* 0.57* 0.52*  CALCIUM 9.0 9.1 8.2* 7.7*   Liver Function Tests:  Recent Labs Lab 08/17/14 0301 08/18/14 0652 08/23/14 1035  AST 13* 11* 13*  ALT 9* 8* 10*  ALKPHOS 72 61 67  BILITOT 0.9 0.7 0.6  PROT 7.6 7.0 7.1  ALBUMIN 3.2* 2.8* 3.0*    Recent Labs Lab 08/17/14 0301  LIPASE 16*   No results for input(s): AMMONIA in the last 168 hours. CBC:  Recent Labs Lab 08/17/14 0301 08/18/14 0652 08/22/14 1638 08/23/14 1035  WBC 11.0* 9.0 17.6* 18.4*  NEUTROABS 8.0*  --  15.2*  --   HGB 14.6 13.4 15.8 14.0  HCT 42.4 39.5 45.5 41.3  MCV 93.4 93.4 92.9 92.8  PLT 353 417* 427* 389   Cardiac Enzymes:  Recent Labs Lab 08/17/14 0301 08/22/14 1638  TROPONINI <0.03 <0.03   BNP (last 3 results)  Recent Labs  08/06/14 1609  BNP 16.0    ProBNP (last 3 results) No results for input(s): PROBNP in the last 8760 hours.  CBG: No results for input(s): GLUCAP in the last 168 hours.  Recent Results (from the past 240 hour(s))  Blood culture (routine x 2)     Status: None (Preliminary result)   Collection Time: 08/22/14  5:51 PM  Result Value Ref Range Status   Specimen Description BLOOD RIGHT ARM  Final   Special Requests BOTTLES DRAWN AEROBIC AND ANAEROBIC [place]  Final   Culture NO GROWTH 1 DAY  Final   Report Status PENDING  Incomplete  Blood culture (routine x 2)     Status: None (Preliminary result)   Collection Time: 08/22/14  5:53 PM  Result Value Ref Range Status   Specimen Description LEFT ANTECUBITAL  Final   Special Requests BOTTLES DRAWN AEROBIC AND ANAEROBIC [place]  Final   Culture NO GROWTH 1 DAY  Final   Report Status PENDING  Incomplete     Studies: Dg Chest 1 View  08/23/2014   CLINICAL DATA:  Post thoracentesis  EXAM: CHEST  1 VIEW  COMPARISON:  08/22/2014   FINDINGS: Cardiomediastinal silhouette is stable. Small residual left pleural effusion with left basilar atelectasis or infiltrate. No pulmonary edema. No pneumothorax. Right lung is clear.  IMPRESSION: Small residual left pleural effusion with left basilar atelectasis or infiltrate. No pneumothorax.   Electronically Signed   By: Lahoma Crocker M.D.   On: 08/23/2014 11:52   Ct Angio Chest Pe W/cm &/or Wo Cm  08/22/2014   CLINICAL DATA:  Chest pain gain this morning radiating to shoulders and back, recent diagnosis of lung cancer with malignant pleural effusion and osseous metastases, recent pneumonia, former smoker  EXAM: CT ANGIOGRAPHY CHEST WITH CONTRAST  TECHNIQUE: Multidetector CT imaging of the chest was performed using the standard protocol during bolus administration of intravenous contrast. Multiplanar CT image reconstructions and MIPs were obtained to evaluate the vascular anatomy.  CONTRAST:  172m OMNIPAQUE IOHEXOL 350 MG/ML SOLN IV  COMPARISON:  08/11/2014  FINDINGS: Aorta normal caliber without aneurysm or dissection.  Respiratory motion artifacts in the lower lobes, mild.  Pulmonary arteries well opacified and patent.  No evidence of pulmonary embolism.  Enlarged inferior RIGHT hilar node 14 mm short axis image 45 unchanged.  Enlarged AP window lymph node 15 mm short axis image 34 unchanged.  Partially loculated LEFT pleural effusion little changed.  Dependent atelectasis RIGHT lower lobe.  Mild compressive atelectasis of the posterior aspect LEFT upper lobe.  Complete atelectasis of LEFT lower lobe with again identified LEFT perihilar mass measuring 3.6 x 3.0 cm image 41 unchanged.  This causes compression of the pulmonary arteries and the LEFT upper lobe bronchus.  No additional infiltrate, mass, or nodule.  No pneumothorax.  Perihepatic ascites seen in RIGHT upper quadrant.  Bone destruction of T3 vertebral body and posterior LEFT 8th and 10th ribs consistent with lytic metastases.  Review of the MIP  images confirms the above findings.  IMPRESSION: No evidence of pulmonary embolism.  Partially loculated moderate-sized LEFT pleural effusion with LEFT lower lobe atelectasis.  LEFT perihilar mass 3.6 x 3.0 cm unchanged.  Thoracic adenopathy and osseous metastases again identified.  Perihepatic ascites.   Electronically Signed   By: MLavonia DanaM.D.   On: 08/22/2014 17:54   Dg Chest Port 1 View  08/22/2014   CLINICAL DATA:  Shortness of breath and chest pain, history of lung cancer  EXAM: PORTABLE CHEST - 1 VIEW  COMPARISON:  08/11/2014, 08/17/2014  FINDINGS: Enlarging left-sided pleural effusion is again noted. Persistent left hilar mass lesion is seen. The right lung remains clear. The cardiac shadow is stable. No bony abnormality is seen.  IMPRESSION: Increasing left-sided pleural effusion.   Electronically Signed   By: MInez CatalinaM.D.   On: 08/22/2014 17:09   UKoreaThoracentesis Asp Pleural Space W/img Guide  08/23/2014   CLINICAL DATA:  Presumed lung cancer, LEFT pleural effusion  EXAM: ULTRASOUND GUIDED DIAGNOSTIC AND THERAPEUTIC LEFT THORACENTESIS  COMPARISON:  CT chest 08/22/2014  PROCEDURE: Procedure, benefits, and risks of procedure were discussed with patient.  Written informed consent for procedure was obtained.  Time out protocol followed.  Pleural effusion localized by ultrasound at the posterior LEFT hemithorax.  Skin prepped and draped in usual sterile fashion.  Skin and soft tissues anesthetized with 8 mL of 1% lidocaine.  8 French thoracentesis catheter placed into the LEFT pleural space.  700 mL of bloody fluid aspirated by syringe pump.  Procedure tolerated well by patient without immediate complication.  COMPLICATIONS: None  FINDINGS: A total of approximately 700 ml of LEFT pleural fluid was removed. A fluid sample of 120 mL was sent for laboratory analysis.  IMPRESSION: Successful ultrasound guided LEFT thoracentesis yielding 700 mL of pleural fluid.   Electronically Signed   By: Lavonia Dana  M.D.   On: 08/23/2014 12:44    Scheduled Meds: . chlorhexidine  15 mL Mouth Rinse BID  . dexamethasone  8 mg Oral TID  . feeding supplement (ENSURE ENLIVE)  237 mL Oral BID BM  . fentaNYL  25 mcg Transdermal Q72H  . oxyCODONE  5 mg Oral TID PC & HS  . piperacillin-tazobactam (ZOSYN)  IV  3.375 g Intravenous Q8H  . vancomycin  1,000 mg Intravenous Q8H   Continuous Infusions: . sodium chloride 50 mL/hr at 08/23/14 0850    Active Problems:   Primary lung adenocarcinoma   Bone metastasis   Essential hypertension   Recurrent left pleural effusion    Time spent: 35 minutes    [facility] [facility] [facility] Pager [phone]. If 7PM-7AM, please contact night-coverage at [email or web address], password [facility] 08/23/2014, 3:43 PM  LOS: 1 day

## 2014-08-23 NOTE — Progress Notes (Signed)
INITIAL NUTRITION ASSESSMENT Pt meets criteria for MODERATE MALNUTRITION in the context of  ACUTE illness as evidenced by eating <50% estimated needs for >5 days and mild muscle/fat wasting.  DOCUMENTATION CODES Per approved criteria  -Moderate (non severe) malnutrition in the context of acute illness   INTERVENTION: Ensure Enlive po tID, each supplement provides 350 kcal and 20 grams of protein  Snacks per pt request  Monitor oral intake and change supplements/add snacks as warranted  NUTRITION DIAGNOSIS: Inadequate oral intake related to loss of appetite as evidenced by Eating less than 50% of estimated needs for ~5 days  Goal: Pt to meet >/= 90% of their estimated nutrition needs   Monitor:  Oral intake, weight, labs, plans regarding cancer treatment/goals of care  Reason for Assessment: MST  49 y.o. 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 Presents with increasing dyspnea and left-sided chest pain  Spoke to family member. Since pts last admission (d/c 5/4) he has had a poor appetite. He had a radiation session on Friday and ate a whole cheeseburger afterwards. Since then he has had only bites.   Family member states he weighed 166 lbs on Friday during his radiation session (likely fluid).   Pt has had nausea and constipation.   Nutrition Focused Physical Exam:  Subcutaneous Fat:  Orbital Region: well nourished Upper Arm Region: well nourished Thoracic and Lumbar Region: mild loss per family  Muscle:  Temple Region: well nourished Clavicle Bone Region: mild muscle loss Clavicle and Acromion Bone Region: mild muscle loss Scapular Bone Region: n/a Dorsal Hand: well nourished Patellar Region: n/a Anterior Thigh Region: n/a Posterior Calf Region: n/a  Edema: none noted   Nutrition Focused Physical  Exam: WDL   Height: Ht Readings from Last 1 Encounters:  08/22/14 '5\' 8"'$  (1.727 m)    Weight: Wt Readings from Last 1 Encounters:  08/22/14 158 lb 4.6 oz (71.8 kg)    Ideal Body Weight: 142 lbs  % Ideal Body Weight: 109%  Wt Readings from Last 10 Encounters:  08/22/14 158 lb 4.6 oz (71.8 kg)  08/18/14 155 lb 12.8 oz (70.67 kg)  08/18/14 155 lb (70.308 kg)  08/17/14 150 lb (68.04 kg)  08/17/14 150 lb (68.04 kg)  08/14/14 145 lb (65.772 kg)  08/13/14 161 lb 6.4 oz (73.211 kg)  08/11/14 170 lb (77.111 kg)  08/07/14 173 lb 6.4 oz (78.654 kg)  07/09/14 155 lb (70.308 kg)    Usual Body Weight: 155  % Usual Body Weight: 97%  BMI:  Body mass index is 24.07 kg/(m^2).  Estimated Nutritional Needs: Kcal: 2100-2300 (31-34 kcal/kg) Protein: 89-102 (1.3-1.5 g/kg) Fluid: 2.1 liters  Skin: WDL  Diet Order: Diet regular Room service appropriate?: Yes; Fluid consistency:: ThinRegular  EDUCATION NEEDS: -Education not appropriate at this time-pt seemed dazed/confused   Intake/Output Summary (Last 24 hours) at 08/23/14 1627 Last data filed at 08/23/14 1508  Gross per 24 hour  Intake    240 ml  Output    350 ml  Net   -110 ml    Last BM: 5/6  Labs:   Recent Labs Lab 08/18/14 0652 08/22/14 1638 08/23/14 1035  NA 137 138 136  K 4.6 3.8 4.1  CL 102 103 102  CO2 '24 25 24  '$ BUN '16 16 14  '$ CREATININE 0.58* 0.57* 0.52*  CALCIUM 9.1 8.2* 7.7*  GLUCOSE 125* 113* 104*  Elevated  WBC 18.4- likely steroid related  CBG (last 3)  No results for input(s): GLUCAP in the last 72 hours.  Scheduled Meds: . chlorhexidine  15 mL Mouth Rinse BID  . dexamethasone  8 mg Oral TID  . feeding supplement (ENSURE ENLIVE)  237 mL Oral BID BM  . fentaNYL  25 mcg Transdermal Q72H  . oxyCODONE  5 mg Oral TID PC & HS    Continuous Infusions: . sodium chloride 50 mL/hr at 08/23/14 0850    Past Medical History  Diagnosis Date  . Primary lung adenocarcinoma 08/17/2014    Past  Surgical History  Procedure Laterality Date  . Appendectomy      Burtis Junes RD, LDN Nutrition Pager: [phone] 08/23/2014 4:27 PM

## 2014-08-23 NOTE — Progress Notes (Signed)
Thoracentesis complete no signs of distress. 79m red colored pleural fluid removed. Taken for post chest x-ray.

## 2014-08-24 DIAGNOSIS — E440 Moderate protein-calorie malnutrition: Secondary | ICD-10-CM | POA: Insufficient documentation

## 2014-08-24 DIAGNOSIS — C7951 Secondary malignant neoplasm of bone: Secondary | ICD-10-CM

## 2014-08-24 LAB — CBC
HEMATOCRIT: 41.9 % (ref 39.0–52.0)
Hemoglobin: 14.4 g/dL (ref 13.0–17.0)
MCH: 32.1 pg (ref 26.0–34.0)
MCHC: 34.4 g/dL (ref 30.0–36.0)
MCV: 93.3 fL (ref 78.0–100.0)
Platelets: 401 10*3/uL — ABNORMAL HIGH (ref 150–400)
RBC: 4.49 MIL/uL (ref 4.22–5.81)
RDW: 13 % (ref 11.5–15.5)
WBC: 17.5 10*3/uL — ABNORMAL HIGH (ref 4.0–10.5)

## 2014-08-24 LAB — BASIC METABOLIC PANEL
ANION GAP: 9 (ref 5–15)
BUN: 15 mg/dL (ref 6–20)
CHLORIDE: 102 mmol/L (ref 101–111)
CO2: 24 mmol/L (ref 22–32)
Calcium: 7.8 mg/dL — ABNORMAL LOW (ref 8.9–10.3)
Creatinine, Ser: 0.54 mg/dL — ABNORMAL LOW (ref 0.61–1.24)
GFR calc non Af Amer: >60 mL/min (ref >60)
Glucose, Bld: 109 mg/dL — ABNORMAL HIGH (ref 70–99)
POTASSIUM: 4.7 mmol/L (ref 3.5–5.1)
Sodium: 135 mmol/L (ref 135–145)

## 2014-08-24 MED ORDER — DOCUSATE SODIUM 100 MG PO CAPS
100.0000 mg | ORAL_CAPSULE | Freq: Two times a day (BID) | ORAL | Status: DC
  Administered 2014-08-24 – 2014-08-25 (×3): 100 mg via ORAL
  Filled 2014-08-24 (×3): qty 1

## 2014-08-24 MED ORDER — MORPHINE SULFATE (CONCENTRATE) 10 MG/0.5ML PO SOLN
10.0000 mg | ORAL | Status: DC | PRN
  Administered 2014-08-24 – 2014-08-25 (×7): 10 mg via ORAL
  Filled 2014-08-24 (×7): qty 0.5

## 2014-08-24 MED ORDER — BISACODYL 10 MG RE SUPP: 10.0000 mg | Freq: Every day | RECTAL | Status: DC | PRN

## 2014-08-24 MED ORDER — MORPHINE SULFATE (CONCENTRATE) 10 MG/0.5ML PO SOLN
10.0000 mg | ORAL | Status: AC
Start: 2014-08-24 — End: 2014-08-24
  Administered 2014-08-24: 10 mg via ORAL
  Filled 2014-08-24: qty 0.5

## 2014-08-24 MED ORDER — CETYLPYRIDINIUM CHLORIDE 0.05 % MT LIQD: 7.0000 mL | OROMUCOSAL | Status: DC | PRN

## 2014-08-24 NOTE — Progress Notes (Signed)
UR chart review completed.  

## 2014-08-24 NOTE — Progress Notes (Signed)
Subjective: Patient in bed.  Family at bedside.  Lunch delivered.  He reports his pain is much improved with pain regimen change.    He has not decided what he wishes to do with regards to his care.  Family has not discussed either.  I will return later today after lunch at which time decisions will need to take place regarding the patient's goals of care and the most appropriate next step.  I re-visited the patient today.  He reports that his goals of care are to "not suffer."  He knows that his disease is incurable and the role of radiation was purely palliative only.  He is not a candidate for systemic chemotherapy.  He is agreeable to pursue Hospice.  I have placed the order. I have also altered pain regimen as outlined below.  Objective: Vital signs in last 24 hours: Temp:  [97.6 F (36.4 C)-98.3 F (36.8 C)] 97.6 F (36.4 C) (05/10 0527) Pulse Rate:  [82-101] 82 (05/10 0527) Resp:  [20] 20 (05/10 0527) BP: (123-128)/(89-95) 124/89 mmHg (05/10 0527) SpO2:  [97 %-98 %] 98 % (05/10 0527)  Intake/Output from previous day: 05/09 0800 - 05/10 0759 In: 2403.3 [P.O.:480; I.V.:1923.3] Out: 950 [Urine:950] Intake/Output this shift: Total I/O In: 240 [P.O.:240] Out: 350 [Urine:350]  General appearance: alert, cooperative, mild distress and sister at bedside.  Lab Results:   Recent Labs  08/23/14 1035 08/24/14 0857  WBC 18.4* 17.5*  HGB 14.0 14.4  HCT 41.3 41.9  PLT 389 401*   BMET  Recent Labs  08/23/14 1035 08/24/14 0857  NA 136 135  K 4.1 4.7  CL 102 102  CO2 24 24  GLUCOSE 104* 109*  BUN 14 15  CREATININE 0.52* 0.54*  CALCIUM 7.7* 7.8*    Studies/Results: Dg Chest 1 View  08/23/2014   CLINICAL DATA:  Post thoracentesis  EXAM: CHEST  1 VIEW  COMPARISON:  08/22/2014  FINDINGS: Cardiomediastinal silhouette is stable. Small residual left pleural effusion with left basilar atelectasis or infiltrate. No pulmonary edema. No pneumothorax. Right lung is clear.   IMPRESSION: Small residual left pleural effusion with left basilar atelectasis or infiltrate. No pneumothorax.   Electronically Signed   By: Lahoma Crocker M.D.   On: 08/23/2014 11:52   Ct Angio Chest Pe W/cm &/or Wo Cm  08/22/2014   CLINICAL DATA:  Chest pain gain this morning radiating to shoulders and back, recent diagnosis of lung cancer with malignant pleural effusion and osseous metastases, recent pneumonia, former smoker  EXAM: CT ANGIOGRAPHY CHEST WITH CONTRAST  TECHNIQUE: Multidetector CT imaging of the chest was performed using the standard protocol during bolus administration of intravenous contrast. Multiplanar CT image reconstructions and MIPs were obtained to evaluate the vascular anatomy.  CONTRAST:  126m OMNIPAQUE IOHEXOL 350 MG/ML SOLN IV  COMPARISON:  08/11/2014  FINDINGS: Aorta normal caliber without aneurysm or dissection.  Respiratory motion artifacts in the lower lobes, mild.  Pulmonary arteries well opacified and patent.  No evidence of pulmonary embolism.  Enlarged inferior RIGHT hilar node 14 mm short axis image 45 unchanged.  Enlarged AP window lymph node 15 mm short axis image 34 unchanged.  Partially loculated LEFT pleural effusion little changed.  Dependent atelectasis RIGHT lower lobe.  Mild compressive atelectasis of the posterior aspect LEFT upper lobe.  Complete atelectasis of LEFT lower lobe with again identified LEFT perihilar mass measuring 3.6 x 3.0 cm image 41 unchanged.  This causes compression of the pulmonary arteries and the LEFT upper lobe  bronchus.  No additional infiltrate, mass, or nodule.  No pneumothorax.  Perihepatic ascites seen in RIGHT upper quadrant.  Bone destruction of T3 vertebral body and posterior LEFT 8th and 10th ribs consistent with lytic metastases.  Review of the MIP images confirms the above findings.  IMPRESSION: No evidence of pulmonary embolism.  Partially loculated moderate-sized LEFT pleural effusion with LEFT lower lobe atelectasis.  LEFT perihilar  mass 3.6 x 3.0 cm unchanged.  Thoracic adenopathy and osseous metastases again identified.  Perihepatic ascites.   Electronically Signed   By: Lavonia Dana M.D.   On: 08/22/2014 17:54   Dg Chest Port 1 View  08/22/2014   CLINICAL DATA:  Shortness of breath and chest pain, history of lung cancer  EXAM: PORTABLE CHEST - 1 VIEW  COMPARISON:  08/11/2014, 08/17/2014  FINDINGS: Enlarging left-sided pleural effusion is again noted. Persistent left hilar mass lesion is seen. The right lung remains clear. The cardiac shadow is stable. No bony abnormality is seen.  IMPRESSION: Increasing left-sided pleural effusion.   Electronically Signed   By: Inez Catalina M.D.   On: 08/22/2014 17:09   US Thoracentesis Asp Pleural Space W/img Guide  08/23/2014   CLINICAL DATA:  Presumed lung cancer, LEFT pleural effusion  EXAM: ULTRASOUND GUIDED DIAGNOSTIC AND THERAPEUTIC LEFT THORACENTESIS  COMPARISON:  CT chest 08/22/2014  PROCEDURE: Procedure, benefits, and risks of procedure were discussed with patient.  Written informed consent for procedure was obtained.  Time out protocol followed.  Pleural effusion localized by ultrasound at the posterior LEFT hemithorax.  Skin prepped and draped in usual sterile fashion.  Skin and soft tissues anesthetized with 8 mL of 1% lidocaine.  8 French thoracentesis catheter placed into the LEFT pleural space.  700 mL of bloody fluid aspirated by syringe pump.  Procedure tolerated well by patient without immediate complication.  COMPLICATIONS: None  FINDINGS: A total of approximately 700 ml of LEFT pleural fluid was removed. A fluid sample of 120 mL was sent for laboratory analysis.  IMPRESSION: Successful ultrasound guided LEFT thoracentesis yielding 700 mL of pleural fluid.   Electronically Signed   By: Lavonia Dana M.D.   On: 08/23/2014 12:44    Medications: I have reviewed the patient's current medications.  Assessment/Plan: 1. Stage IV adenocarcinoma, presumed lung primary radiographically.  Started palliative radiation on 5/5. Plan was to get the patient through palliative radiation all the while discuss goals of care and then pursue comfort measures as he is not a candidate for systemic chemotherapy. Now that he is re-hospitalized for chest pain and SOB, he would be best served with Hospice intervention. I will return later today to discuss plan of care with the patient and family.  2. Back pain, improved with Morphine IV and increase Fentanyl patch.  I have discontinued IV Morphine and changed to Roxanol.  I have ordered this medication now and every 2 hours PRN. 3. DNR  4. I have ordered Hospice consult.  He has days to weeks of life expected.  He would benefit from inpatient Hospice. 5. Will follow as an inpatient.  Patient and plan discussed with Dr. Ancil Linsey and she is in agreement with the aforementioned.    LOS: 2 days    KEFALAS,THOMAS 08/24/2014   AS above. He is a candidate for hospice house. Life expectancy is as above. Will aggressively work on pain control and comfort. Hospice consult placed. Donald Pore MD

## 2014-08-24 NOTE — Care Management Note (Signed)
Case Management Note  Patient Details  Name: [name] MRN: [id number] Date of Birth: 1965/10/28  Subjective/Objective:                  Pt admitted from home with dyspnea, new lung CA, and pleural effusions. Pt currently lives with family but unsure if pt will be able to return home at discharge. Pt had been independent with ADl's.  Action/Plan: Will continue to follow for discharge planning needs. ? Discharge with hospice at home or residential hospice. Expected Discharge Date:  08/25/14               Expected Discharge Plan:  [place]  In-House Referral:  Clinical Social Work  Discharge planning Services  CM Consult  Post Acute Care Choice:    Choice offered to:     DME Arranged:    DME Agency:     HH Arranged:    HH Agency:     Status of Service:  In process, will continue to follow  Medicare Important Message Given:    Date Medicare IM Given:    Medicare IM give by:    Date Additional Medicare IM Given:    Additional Medicare Important Message give by:     If discussed at [place] of Stay Meetings, dates discussed:    Additional Comments:  Joylene Draft, RN 08/24/2014, 3:22 PM

## 2014-08-24 NOTE — Progress Notes (Signed)
[facility] PROGRESS NOTE  [name] FTD:[id number] DOB: May 05, 1965 DOA: 08/22/2014 PCP: No PCP Per Patient  Assessment/Plan:  Recurrent left-sided pleural effusion. Likely malignant. S/P thoracentesis 08/23/14.  Concern for underlying pneumonia given leukocytosis but this likely related to steroids. He remains afebrile and non-toxic appearing. Vanc and zosyn given for 2 days.    Left-sided chest pain. Generalized pain worse better controlled. Appreciate oncology assistance with pain management. Pain likely related to his pleural effusion and progression of malignancy. Hospice care being discussed by family/patient. See oncology note 08/23/14.   Hypertension. Stable.  Adenocarcinoma, stage IV, presumed lung primary. appreciate oncology input. Oncology recommending hospice care for comfort. Family discussing  Constipation: likely associated with pain med. Provide softener and suppository as needed.   Code Status: DNR Family Communication: family at bedside Disposition Plan: hopefully to hospice facility today or tomorrow   Consultants:  oncology  Procedures:  Thoracentesis 08/23/14  Antibiotics:  Vancomycin 08/22/14>>08/23/14  Zosyn 08/22/14- 08/23/14  HPI/Subjective: Patient lethargic but responsive. Reports improved pain control but "does not last quite long enough". States "im trying to eat". Complains abdominal distention/constipation  Objective: Filed Vitals:   08/24/14 0527  BP: 124/89  Pulse: 82  Temp: 97.6 F (36.4 C)  Resp: 20    Intake/Output Summary (Last 24 hours) at 08/24/14 1028 Last data filed at 08/24/14 0917  Gross per 24 hour  Intake 2643.34 ml  Output    950 ml  Net 1693.34 ml   Filed Weights   08/22/14 1607 08/22/14 2208  Weight: 70.308 kg (155 lb) 71.8 kg (158 lb 4.6 oz)    Exam:   General:  Lethargic ill appearing  Cardiovascular: S1 and S2. No m/g/r no LE edema  Respiratory: normal effort but somewhat shallow BS diminished no  wheeze  Abdomen: mildly distended with sluggish BS non-tender  Musculoskeletal: no clubbing or cyanis   Data Reviewed: Basic Metabolic Panel:  Recent Labs Lab 08/18/14 0652 08/22/14 1638 08/23/14 1035 08/24/14 0857  NA 137 138 136 135  K 4.6 3.8 4.1 4.7  CL 102 103 102 102  CO2 '24 25 24 24  '$ GLUCOSE 125* 113* 104* 109*  BUN '16 16 14 15  '$ CREATININE 0.58* 0.57* 0.52* 0.54*  CALCIUM 9.1 8.2* 7.7* 7.8*   Liver Function Tests:  Recent Labs Lab 08/18/14 0652 08/23/14 1035  AST 11* 13*  ALT 8* 10*  ALKPHOS 61 67  BILITOT 0.7 0.6  PROT 7.0 7.1  ALBUMIN 2.8* 3.0*   No results for input(s): LIPASE, AMYLASE in the last 168 hours. No results for input(s): AMMONIA in the last 168 hours. CBC:  Recent Labs Lab 08/18/14 0652 08/22/14 1638 08/23/14 1035 08/24/14 0857  WBC 9.0 17.6* 18.4* 17.5*  NEUTROABS  --  15.2*  --   --   HGB 13.4 15.8 14.0 14.4  HCT 39.5 45.5 41.3 41.9  MCV 93.4 92.9 92.8 93.3  PLT 417* 427* 389 401*   Cardiac Enzymes:  Recent Labs Lab 08/22/14 1638  TROPONINI <0.03   BNP (last 3 results)  Recent Labs  08/06/14 1609  BNP 16.0    ProBNP (last 3 results) No results for input(s): PROBNP in the last 8760 hours.  CBG: No results for input(s): GLUCAP in the last 168 hours.  Recent Results (from the past 240 hour(s))  Blood culture (routine x 2)     Status: None (Preliminary result)   Collection Time: 08/22/14  5:51 PM  Result Value Ref Range Status   Specimen Description  BLOOD RIGHT ARM  Final   Special Requests BOTTLES DRAWN AEROBIC AND ANAEROBIC [place]  Final   Culture NO GROWTH 1 DAY  Final   Report Status PENDING  Incomplete  Blood culture (routine x 2)     Status: None (Preliminary result)   Collection Time: 08/22/14  5:53 PM  Result Value Ref Range Status   Specimen Description LEFT ANTECUBITAL  Final   Special Requests BOTTLES DRAWN AEROBIC AND ANAEROBIC [place]  Final   Culture NO GROWTH 1 DAY  Final   Report Status PENDING   Incomplete     Studies: Dg Chest 1 View  08/23/2014   CLINICAL DATA:  Post thoracentesis  EXAM: CHEST  1 VIEW  COMPARISON:  08/22/2014  FINDINGS: Cardiomediastinal silhouette is stable. Small residual left pleural effusion with left basilar atelectasis or infiltrate. No pulmonary edema. No pneumothorax. Right lung is clear.  IMPRESSION: Small residual left pleural effusion with left basilar atelectasis or infiltrate. No pneumothorax.   Electronically Signed   By: Lahoma Crocker M.D.   On: 08/23/2014 11:52   Ct Angio Chest Pe W/cm &/or Wo Cm  08/22/2014   CLINICAL DATA:  Chest pain gain this morning radiating to shoulders and back, recent diagnosis of lung cancer with malignant pleural effusion and osseous metastases, recent pneumonia, former smoker  EXAM: CT ANGIOGRAPHY CHEST WITH CONTRAST  TECHNIQUE: Multidetector CT imaging of the chest was performed using the standard protocol during bolus administration of intravenous contrast. Multiplanar CT image reconstructions and MIPs were obtained to evaluate the vascular anatomy.  CONTRAST:  120m OMNIPAQUE IOHEXOL 350 MG/ML SOLN IV  COMPARISON:  08/11/2014  FINDINGS: Aorta normal caliber without aneurysm or dissection.  Respiratory motion artifacts in the lower lobes, mild.  Pulmonary arteries well opacified and patent.  No evidence of pulmonary embolism.  Enlarged inferior RIGHT hilar node 14 mm short axis image 45 unchanged.  Enlarged AP window lymph node 15 mm short axis image 34 unchanged.  Partially loculated LEFT pleural effusion little changed.  Dependent atelectasis RIGHT lower lobe.  Mild compressive atelectasis of the posterior aspect LEFT upper lobe.  Complete atelectasis of LEFT lower lobe with again identified LEFT perihilar mass measuring 3.6 x 3.0 cm image 41 unchanged.  This causes compression of the pulmonary arteries and the LEFT upper lobe bronchus.  No additional infiltrate, mass, or nodule.  No pneumothorax.  Perihepatic ascites seen in RIGHT upper  quadrant.  Bone destruction of T3 vertebral body and posterior LEFT 8th and 10th ribs consistent with lytic metastases.  Review of the MIP images confirms the above findings.  IMPRESSION: No evidence of pulmonary embolism.  Partially loculated moderate-sized LEFT pleural effusion with LEFT lower lobe atelectasis.  LEFT perihilar mass 3.6 x 3.0 cm unchanged.  Thoracic adenopathy and osseous metastases again identified.  Perihepatic ascites.   Electronically Signed   By: MLavonia DanaM.D.   On: 08/22/2014 17:54   Dg Chest Port 1 View  08/22/2014   CLINICAL DATA:  Shortness of breath and chest pain, history of lung cancer  EXAM: PORTABLE CHEST - 1 VIEW  COMPARISON:  08/11/2014, 08/17/2014  FINDINGS: Enlarging left-sided pleural effusion is again noted. Persistent left hilar mass lesion is seen. The right lung remains clear. The cardiac shadow is stable. No bony abnormality is seen.  IMPRESSION: Increasing left-sided pleural effusion.   Electronically Signed   By: MInez CatalinaM.D.   On: 08/22/2014 17:09   UKoreaThoracentesis Asp Pleural Space W/img Guide  08/23/2014  CLINICAL DATA:  Presumed lung cancer, LEFT pleural effusion  EXAM: ULTRASOUND GUIDED DIAGNOSTIC AND THERAPEUTIC LEFT THORACENTESIS  COMPARISON:  CT chest 08/22/2014  PROCEDURE: Procedure, benefits, and risks of procedure were discussed with patient.  Written informed consent for procedure was obtained.  Time out protocol followed.  Pleural effusion localized by ultrasound at the posterior LEFT hemithorax.  Skin prepped and draped in usual sterile fashion.  Skin and soft tissues anesthetized with 8 mL of 1% lidocaine.  8 French thoracentesis catheter placed into the LEFT pleural space.  700 mL of bloody fluid aspirated by syringe pump.  Procedure tolerated well by patient without immediate complication.  COMPLICATIONS: None  FINDINGS: A total of approximately 700 ml of LEFT pleural fluid was removed. A fluid sample of 120 mL was sent for laboratory  analysis.  IMPRESSION: Successful ultrasound guided LEFT thoracentesis yielding 700 mL of pleural fluid.   Electronically Signed   By: Lavonia Dana M.D.   On: 08/23/2014 12:44    Scheduled Meds: . dexamethasone  8 mg Oral TID  . docusate sodium  100 mg Oral BID  . feeding supplement (ENSURE ENLIVE)  237 mL Oral TID BM  . [START ON 08/26/2014] fentaNYL  50 mcg Transdermal Q72H   Continuous Infusions: . sodium chloride 50 mL/hr at 08/23/14 2305    Active Problems:   Tobacco abuse   Primary lung adenocarcinoma   Bone metastasis   Essential hypertension   Recurrent left pleural effusion   Lung cancer, primary, with metastasis from lung to other site   Acute dyspnea   Malnutrition of moderate degree    Time spent: 30 minutes    [facility] Pager [phone]. If 7PM-7AM, please contact night-coverage at [email or web address], password [facility] 08/24/2014, 10:28 AM  LOS: 2 days

## 2014-08-25 ENCOUNTER — Ambulatory Visit (HOSPITAL_COMMUNITY): Payer: Self-pay | Admitting: Oncology

## 2014-08-25 ENCOUNTER — Other Ambulatory Visit (HOSPITAL_COMMUNITY): Payer: Self-pay

## 2014-08-25 LAB — BASIC METABOLIC PANEL
Anion gap: 7 (ref 5–15)
BUN: 17 mg/dL (ref 6–20)
CALCIUM: 7.8 mg/dL — AB (ref 8.9–10.3)
CO2: 24 mmol/L (ref 22–32)
CREATININE: 0.44 mg/dL — AB (ref 0.61–1.24)
Chloride: 104 mmol/L (ref 101–111)
GFR calc Af Amer: >60 mL/min (ref >60)
GFR calc non Af Amer: >60 mL/min (ref >60)
GLUCOSE: 114 mg/dL — AB (ref 70–99)
Potassium: 4.2 mmol/L (ref 3.5–5.1)
SODIUM: 135 mmol/L (ref 135–145)

## 2014-08-25 MED ORDER — MORPHINE SULFATE (CONCENTRATE) 10 MG/0.5ML PO SOLN: 10.0000 mg | ORAL | Status: AC | PRN

## 2014-08-25 MED ORDER — DOCUSATE SODIUM 100 MG PO CAPS: 100.0000 mg | ORAL_CAPSULE | Freq: Two times a day (BID) | ORAL | Status: AC

## 2014-08-25 MED ORDER — LORAZEPAM 1 MG PO TABS: 1.0000 mg | ORAL_TABLET | Freq: Three times a day (TID) | ORAL | Status: AC | PRN

## 2014-08-25 MED ORDER — FENTANYL 50 MCG/HR TD PT72: 50.0000 ug | MEDICATED_PATCH | TRANSDERMAL | Status: AC

## 2014-08-25 NOTE — Care Management Note (Signed)
Case Management Note  Patient Details  Name: [name] MRN: [id number] Date of Birth: 06/24/1965  Subjective/Objective:                    Action/Plan:   Expected Discharge Date:  08/25/14               Expected Discharge Plan:  Hospice Medical Facility  In-House Referral:  Clinical Social Work  Discharge planning Services  CM Consult  Post Acute Care Choice:  Hospice Choice offered to:  Patient  DME Arranged:    DME Agency:     HH Arranged:    [place] Agency:     Status of Service:  In process, will continue to follow  Medicare Important Message Given:    Date Medicare IM Given:    Medicare IM give by:    Date Additional Medicare IM Given:    Additional Medicare Important Message give by:     If discussed at [place] of Stay Meetings, dates discussed:    Additional Comments: Pt discharged to [facility]. CSW to arrange discharge to facility. Christinia Gully [place], RN 08/25/2014, 11:40 AM

## 2014-08-25 NOTE — Progress Notes (Signed)
IV catheter removed from LEFT hand/wrist region, catheter tip intact, no s/s of infection noted, patient tolerated well. Transport called and awaiting pick up and transfer to hospice at this time. Hospice called and report given. Family at bedside.

## 2014-08-25 NOTE — Clinical Social Work Note (Signed)
Clinical Social Work Assessment  Patient Details  Name: [name] MRN: [id number] Date of Birth: Mar 14, 1966  Date of referral:  08/25/14               Reason for consult:  End of Life/Hospice                Permission sought to share information with:  Family Supports Permission granted to share information::  Yes, Verbal Permission Granted  Name::     Lucy/Sharon  Agency::     Relationship::  Mother/sister  Contact Information:     Housing/Transportation Living arrangements for the past 2 months:  Single Family Home Source of Information:  Patient Patient Interpreter Needed:  None Criminal Activity/Legal Involvement Pertinent to Current Situation/Hospitalization:  No - Comment as needed Significant Relationships:  Friend, Siblings, Parents Lives with:  Parents Do you feel safe going back to the place where you live?  Yes (Pt feels that [place] would be better option to have additional care.) Need for family participation in patient care:  Yes (Comment)  Care giving concerns:  Pt lives with mother who is on dialysis.   Social Worker assessment / plan: CSW met with pt at bedside this morning. He states that it has been "kind of rough" since his cancer diagnosis several weeks ago. He appeared to be almost accepting at this point, and said he just wants to not hurt anymore. Pt has good family support and was living with his mother prior to hospitalization. His sister is also very involved. Oncology discussed hospice last night and pt and family were open to this. Cindy from [facility] met with pt and family this morning. Pt and sister agreeable to transfer to [place]. Bed is available today. Pt will transfer via [facility]. D/C summary faxed. Pt complaining of a lot of chest pain. CSW notified RN.    Employment status:  Other (Comment) (some lawn care) Insurance information:  Self Pay (Medicaid Pending) PT Recommendations:  Not assessed at this time Information /  Referral to community resources:  Other (Comment Required) (Hospice Home)  Patient/Family's Response to care:  Pt and family have had goals of care conversation with MD and agree to [place].   Patient/Family's Understanding of and Emotional Response to Diagnosis, Current Treatment, and Prognosis:  Pt and family are aware of stage IV cancer diagnosis and very poor prognosis. They have had conversations together and are still processing everything as this is relatively new for them. Pt's sister was emotional while talking to CSW and chaplain in hallway. She shared that are only 12 months apart and have always been very close. Support provided to pt and sister.   Emotional Assessment Appearance:  Appears stated age Attitude/Demeanor/Rapport:  Grieving Affect (typically observed):  Calm, Accepting Orientation:  Oriented to Self, Oriented to Place Alcohol / Substance use:  Not Applicable Psych involvement (Current and /or in the community):  No (Comment)  Discharge Needs  Concerns to be addressed:  Grief and Loss Concerns Readmission within the last 30 days:  Yes Current discharge risk:  Terminally ill Barriers to Discharge:  No Barriers Identified   Salome Arnt, [place] 08/25/2014, 8:28 AM  [phone]

## 2014-08-25 NOTE — Progress Notes (Signed)
[place] from hospice visiting with patient now

## 2014-08-25 NOTE — Discharge Summary (Signed)
Physician Discharge Summary  [name] URK:[id number] DOB: Feb 19, 1966 DOA: 08/22/2014  PCP: No PCP Per Patient  Admit date: 08/22/2014 Discharge date: 08/25/2014  Time spent: 35 minutes  Recommendations for Outpatient Follow-up:  1. Patient discharged to inpatient hospice, please follow up on pain symptoms, focus of care on comfort   Discharge Diagnoses:  Principal Problem:   Primary lung adenocarcinoma Active Problems:   Bone metastasis   Lung cancer, primary, with metastasis from lung to other site   Tobacco abuse   Essential hypertension   Recurrent left pleural effusion   Acute dyspnea   Malnutrition of moderate degree   Discharge Condition: Comfort Care, stable for transport  Diet recommendation: Regular dietr  Filed Weights   08/22/14 1607 08/22/14 2208  Weight: 70.308 kg (155 lb) 71.8 kg (158 lb 4.6 oz)    History of present illness:  [name] is a 49 y.o. 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 He was due to undergo radiotherapy tomorrow. However, in the last few days he has become worse with increasing dyspnea and left-sided chest pain. He has had no fever and no significant cough.  Hospital Course:  Patient is a 49 year old gentleman with a past medical history of stage IV metastatic adenocarcinoma, having MRI of brain performed on 08/18/2014 that showed numerous bilateral peripherally enhancing lesions with restricted diffusion and surrounding vasogenic edema compatible with numerous metastatic disease. A CT scan of abdomen and pelvis performed on 08/17/2014 show skeletal metastasis involving right L1 pedicle with pathologic fracture. Patient also having a history of recurrent malignant pleural effusions undergoing thoracentesis. Patient having multiple recent hospitalizations. He presented to the emergency department on 08/22/2014 presented with complaints  of shortness of breath associated with chest pain and lower back pain. Imaging studies revealed presence of recurrent left-sided pleural effusion likely malignant. He underwent a thoracentesis on 08/23/2014. During this hospitalization he was also treated with empiric IV antimicrobial therapy with vancomycin and Zosyn. Oncology was consulted and goals of care addressed. Options discussed with patient as expressed wishes to focus his care on comfort rather than pursuing further potential burdensome/invasive interventions. Consult placed for hospice. He was discharged to inpatient hospice facility on 08/25/2014.  Procedures:  Ultrasound-guided thoracentesis performed on 08/23/2014  Consultations:  Medical oncology  Interventional radiology  Discharge Exam: Filed Vitals:   08/25/14 0500  BP: 115/83  Pulse: 74  Temp: 97.8 F (36.6 C)  Resp: 19     General: Ill-appearing, in no acute distress. Appears somewhat sedated  Cardiovascular: S1 and S2. No m/g/r no LE edema  Respiratory: normal effort but somewhat shallow BS diminished no wheeze  Abdomen: mildly distended with sluggish BS non-tender  Musculoskeletal: Reporting pain to palpation across her rib cage and lower back  Discharge Instructions   Discharge Instructions    Call MD for:  severe uncontrolled pain    Complete by:  As directed      Diet - low sodium heart healthy    Complete by:  As directed      Increase activity slowly    Complete by:  As directed           Current Discharge Medication List    START taking these medications   Details  docusate sodium (COLACE) 100 MG capsule Take 1 capsule (100 mg total) by mouth 2 (two) times daily. Qty: 10 capsule, Refills: 0    fentaNYL (  DURAGESIC - DOSED MCG/HR) 50 MCG/HR Place 1 patch (50 mcg total) onto the skin every 3 (three) days. Qty: 5 patch, Refills: 0    Morphine Sulfate (MORPHINE CONCENTRATE) 10 MG/0.5ML SOLN concentrated solution Take 0.5 mLs (10 mg  total) by mouth every 4 (four) hours as needed for moderate pain, severe pain or shortness of breath. Qty: 42 mL, Refills: 0      CONTINUE these medications which have CHANGED   Details  LORazepam (ATIVAN) 1 MG tablet Take 1 tablet (1 mg total) by mouth 3 (three) times daily as needed for anxiety. Qty: 15 tablet, Refills: 0      CONTINUE these medications which have NOT CHANGED   Details  dexamethasone (DECADRON) 4 MG tablet Take 2 tablets (8 mg total) by mouth 3 (three) times daily. Qty: 180 tablet, Refills: 0    hydrocortisone cream 1 % Apply topically 2 (two) times daily as needed for itching. Qty: 30 g, Refills: 0      STOP taking these medications     Aspirin-Salicylamide-Caffeine (BC HEADACHE POWDER PO)      fentaNYL (DURAGESIC - DOSED MCG/HR) 25 MCG/HR patch      HYDROcodone-acetaminophen (NORCO/VICODIN) 5-325 MG per tablet      naproxen sodium (ANAPROX) 220 MG tablet      oxyCODONE (OXY IR/ROXICODONE) 5 MG immediate release tablet      prochlorperazine (COMPAZINE) 10 MG tablet      levofloxacin (LEVAQUIN) 500 MG tablet        No Known Allergies    The results of significant diagnostics from this hospitalization (including imaging, microbiology, ancillary and laboratory) are listed below for reference.    Significant Diagnostic Studies: Dg Chest 1 View  08/23/2014   CLINICAL DATA:  Post thoracentesis  EXAM: CHEST  1 VIEW  COMPARISON:  08/22/2014  FINDINGS: Cardiomediastinal silhouette is stable. Small residual left pleural effusion with left basilar atelectasis or infiltrate. No pulmonary edema. No pneumothorax. Right lung is clear.  IMPRESSION: Small residual left pleural effusion with left basilar atelectasis or infiltrate. No pneumothorax.   Electronically Signed   By: Lahoma Crocker M.D.   On: 08/23/2014 11:52   Dg Chest 1 View  08/09/2014   CLINICAL DATA:  Status post left-sided thoracentesis  EXAM: CHEST  1 VIEW  COMPARISON:  Chest radiograph and chest CT August 06, 2014  FINDINGS: Most of the left effusion has been removed by thoracentesis. No pneumothorax. A small left effusion remains with patchy atelectasis in the left mid and lower lung zones. There is moderate consolidation in the medial left base. There is minimal right base atelectasis. Lungs are otherwise clear. Heart size and pulmonary vascularity are within normal limits. There is apparent left hilar adenopathy, appreciated on recent CT.  IMPRESSION: No pneumothorax following thoracentesis. Medial left base consolidation with small left effusion and patchy left mid lower lung zone atelectasis. Minimal right base atelectasis. Left hilar adenopathy.   Electronically Signed   By: Lowella Grip III M.D.   On: 08/09/2014 15:13   Dg Chest 2 View  08/17/2014   CLINICAL DATA:  Right arm pain and dyspnea for 3 weeks.  EXAM: CHEST  2 VIEW  COMPARISON:  08/11/2014  FINDINGS: There is left base consolidation and left pleural effusion, worsened from 08/11/2014. There is left hilar adenopathy, unchanged. The right lung is clear.  IMPRESSION: Enlarging left effusion. Worsening left base consolidation. Known left hilar and mediastinal masses.   Electronically Signed   By: Andreas Newport  M.D.   On: 08/17/2014 04:53   Dg Chest 2 View  08/11/2014   CLINICAL DATA:  Acute onset of right shoulder pain and weakness. Initial encounter.  EXAM: CHEST  2 VIEW  COMPARISON:  Chest radiograph performed 08/09/2014, and CT of the chest performed 08/06/2014  FINDINGS: The lungs are well-aerated. An increasing small left pleural effusion is noted, with patchy left basilar airspace opacity. This may reflect postobstructive pneumonia, superimposed on the patient's known left hilar lung mass. The right lung appears clear.  The heart is normal in size. No acute osseous abnormalities are seen.  IMPRESSION: Increasing small left pleural effusion, with patchy left basilar airspace opacity. This may reflect postobstructive pneumonia,  superimposed on the patient's known left hilar lung mass.   Electronically Signed   By: Garald Balding M.D.   On: 08/11/2014 06:13   Dg Chest 2 View  08/06/2014   CLINICAL DATA:  Tachycardia. Blurred vision in the left on a for 3 weeks. Pain in weakness in the right upper extremity and right-sided chest today. History of smoking.  EXAM: CHEST  2 VIEW  COMPARISON:  None.  FINDINGS: The heart is enlarged. There is significant opacity in the left hemithorax which consists of pleural effusion and atelectasis or consolidation. The right lung is clear. No pulmonary edema.  IMPRESSION: 1. Cardiomegaly without edema. 2. Significant left lung opacity consistent with atelectasis or infiltrate and pleural effusion.   Electronically Signed   By: Nolon Nations M.D.   On: 08/06/2014 16:41   Dg Shoulder Right  08/11/2014   CLINICAL DATA:  Acute onset of right shoulder pain and weakness. Initial encounter.  EXAM: RIGHT SHOULDER - 2+ VIEW  COMPARISON:  Right shoulder radiographs performed 06/14/2014  FINDINGS: There is no evidence of fracture or dislocation. The right humeral head is seated within the glenoid fossa. The acromioclavicular joint is unremarkable in appearance. No significant soft tissue abnormalities are seen. The visualized portions of the right lung are clear.  IMPRESSION: No evidence of fracture or dislocation.   Electronically Signed   By: Garald Balding M.D.   On: 08/11/2014 06:11   Ct Soft Tissue Neck W Contrast  08/14/2014   CLINICAL DATA:  Right-sided neck pain radiating to the right arm. New diagnosis metastatic lung cancer.  EXAM: CT NECK WITH CONTRAST  TECHNIQUE: Multidetector CT imaging of the neck was performed using the standard protocol following the bolus administration of intravenous contrast.  CONTRAST:  50m OMNIPAQUE IOHEXOL 300 MG/ML  SOLN  COMPARISON:  CT chest 08/11/2014  FINDINGS: Pharynx and larynx: No mucosal or submucosal lesion.  Salivary glands: Parotid and submandibular glands  are normal.  Thyroid: Normal  Lymph nodes: No enlarged or low-density nodes in the neck or supraclavicular region. Superior mediastinal disease as shown on previous chest CT.  Vascular: Arterial and venous structures are patent.  Limited intracranial: Negative  Visualized orbits: Normal  Mastoids and visualized paranasal sinuses: Clear  Skeleton: Lytic metastasis to the T3 vertebral body again demonstrated. The degree of involvement of the spinal canal is not clearly shown by CT. No metastatic lesions seen in the cervical spine or the skullbase region.  Upper chest: See results of chest CT.  IMPRESSION: No finding in the neck to explain right-sided neck pain or right arm symptoms. No evidence of mass or lymphadenopathy in the neck.  Metastatic lung cancer as previously shown with extensive involvement in the mediastinum and a lytic metastatic lesion to the left side of the T3 vertebral  body. The extent of spinal canal involvement is not precisely shown by CT.   Electronically Signed   By: Nelson Chimes M.D.   On: 08/14/2014 09:44   Ct Chest W Contrast  08/06/2014   CLINICAL DATA:  Hypotension and tachycardia. Left pleural effusion. Smoker  EXAM: CT CHEST WITH CONTRAST  TECHNIQUE: Multidetector CT imaging of the chest was performed during intravenous contrast administration.  CONTRAST:  43m OMNIPAQUE IOHEXOL 300 MG/ML  SOLN  COMPARISON:  Chest x-ray today  FINDINGS: Image quality degraded by motion.  Large left pleural effusion with compressive atelectasis of the left lower lobe. Left upper lobe is partially expanded. There is a probable mass lesion in the left hilum measuring 28 mm with probable adjacent left hilar adenopathy. Findings are suspicious for carcinoma of the lung. 12 mm right paratracheal lymph node. 12 mm lymph node anterior to the left bronchus. No subcarinal enlarged lymph nodes.  Right lung is clear.  No lung nodule is identified.  Bony destructive lesions compatible with metastatic disease.  There is a lesion on the right involving T3. Lesion in the left L1 vertebral body and pedicle extending into the lamina. Left-sided rib lesions.  Right adrenal enlargement measuring 26 mm. This has low density and most likely is adrenal adenoma rather than metastatic disease.  12 mm right thyroid lesion is indeterminate.  IMPRESSION: Large left pleural effusion with compressive atelectasis left lower lobe. There is a probable mass the left hilum compatible with carcinoma of the lung. PET CT suggested for staging. Bony metastatic disease is noted.  Right adrenal lesion most likely a benign adenoma rather than metastatic disease.  12 mm right thyroid lesion.  Thyroid ultrasound recommended.   Electronically Signed   By: CFranchot GalloM.D.   On: 08/06/2014 18:59   Ct Angio Chest Pe W/cm &/or Wo Cm  08/22/2014   CLINICAL DATA:  Chest pain gain this morning radiating to shoulders and back, recent diagnosis of lung cancer with malignant pleural effusion and osseous metastases, recent pneumonia, former smoker  EXAM: CT ANGIOGRAPHY CHEST WITH CONTRAST  TECHNIQUE: Multidetector CT imaging of the chest was performed using the standard protocol during bolus administration of intravenous contrast. Multiplanar CT image reconstructions and MIPs were obtained to evaluate the vascular anatomy.  CONTRAST:  1031mOMNIPAQUE IOHEXOL 350 MG/ML SOLN IV  COMPARISON:  08/11/2014  FINDINGS: Aorta normal caliber without aneurysm or dissection.  Respiratory motion artifacts in the lower lobes, mild.  Pulmonary arteries well opacified and patent.  No evidence of pulmonary embolism.  Enlarged inferior RIGHT hilar node 14 mm short axis image 45 unchanged.  Enlarged AP window lymph node 15 mm short axis image 34 unchanged.  Partially loculated LEFT pleural effusion little changed.  Dependent atelectasis RIGHT lower lobe.  Mild compressive atelectasis of the posterior aspect LEFT upper lobe.  Complete atelectasis of LEFT lower lobe with again  identified LEFT perihilar mass measuring 3.6 x 3.0 cm image 41 unchanged.  This causes compression of the pulmonary arteries and the LEFT upper lobe bronchus.  No additional infiltrate, mass, or nodule.  No pneumothorax.  Perihepatic ascites seen in RIGHT upper quadrant.  Bone destruction of T3 vertebral body and posterior LEFT 8th and 10th ribs consistent with lytic metastases.  Review of the MIP images confirms the above findings.  IMPRESSION: No evidence of pulmonary embolism.  Partially loculated moderate-sized LEFT pleural effusion with LEFT lower lobe atelectasis.  LEFT perihilar mass 3.6 x 3.0 cm unchanged.  Thoracic adenopathy and  osseous metastases again identified.  Perihepatic ascites.   Electronically Signed   By: Lavonia Dana M.D.   On: 08/22/2014 17:54   Ct Angio Chest Pe W/cm &/or Wo Cm  08/11/2014   CLINICAL DATA:  Acute onset of shortness breath and right-sided back pain. Initial encounter.  EXAM: CT ANGIOGRAPHY CHEST WITH CONTRAST  TECHNIQUE: Multidetector CT imaging of the chest was performed using the standard protocol during bolus administration of intravenous contrast. Multiplanar CT image reconstructions and MIPs were obtained to evaluate the vascular anatomy.  CONTRAST:  129m OMNIPAQUE IOHEXOL 350 MG/ML SOLN  COMPARISON:  Chest radiograph performed earlier today at 5:48 a.m., and CT of the chest performed 08/06/2014  FINDINGS: There is no evidence of pulmonary embolus.  A small to moderate left-sided pleural effusion is noted, with partial consolidation of much of the left lower lobe. This raises concern for postobstructive pneumonia, given the masses at the left hilum. The largest mass measures approximately 3.6 x 3.1 cm, while a smaller 2.6 cm mass is seen more inferiorly at the left hilum.  There is mass effect on the pulmonary artery to the left lower lobe and its branches, which passes between the masses. Vague fluid attenuation is seen tracking along the left side of the mediastinum,  concerning for a malignant pericardial effusion given its focal appearance. Underlying soft tissue inflammation is seen.  There is no evidence of pneumothorax. The right lung appears clear. A 1.4 cm right-sided peribronchial node is seen, also concerning for metastatic disease.  Aortopulmonary window nodes measure up to 1.4 cm in short axis. This is concerning for metastatic disease. The great vessels are grossly unremarkable in appearance. Incidental note is made of a direct origin of the left vertebral artery from the aortic arch. No axillary lymphadenopathy is seen. The visualized portions of the thyroid gland are unremarkable in appearance.  The visualized portions of the liver and spleen are unremarkable.  No acute osseous abnormalities are seen. A prominent 2.0 cm lytic mass is noted within the left side of vertebral body T3, without definite evidence of mass effect on the spinal canal at this time.  Review of the MIP images confirms the above findings.  IMPRESSION: 1. No evidence of pulmonary embolus. 2. Small to moderate left-sided pleural effusion noted, with partial consolidation of much of the left lower lobe. This raises concern for postobstructive pneumonia, given the masses at the left hilum. The largest mass measures 3.6 cm 3.6 x 2.1 cm, while a smaller 2.6 cm mass is seen more inferiorly at the left hilum. 3. Mass-effect on the pulmonary artery to the left lower lobe and its branches, which passes between the masses. Vague fluid attenuation tracking along the left-sided mediastinum raises concern for a malignant pericardial effusion, given its focal appearance. Underlying soft tissue inflammation within the mediastinum. 4. 1.4 cm right-sided peribronchial node ulcer raises concern for metastatic disease. Aortopulmonary window nodes measure up to 1.4 cm in short axis. 5. Prominent 2.0 cm lytic mass noted within the left side of vertebral body T3, without definite evidence of mass-effect on the spinal  canal this time. This is compatible with bony metastatic disease.   Electronically Signed   By: JGarald BaldingM.D.   On: 08/11/2014 06:55   Mr BJeri CosWOQContrast  08/18/2014   CLINICAL DATA:  Lung cancer with metastatic disease.  EXAM: MRI HEAD WITHOUT AND WITH CONTRAST  TECHNIQUE: Multiplanar, multiecho pulse sequences of the brain and surrounding structures were obtained without and  with intravenous contrast.  CONTRAST:  20m MULTIHANCE GADOBENATE DIMEGLUMINE 529 MG/ML IV SOLN  COMPARISON:  CT neck with contrast 08/14/2014. CT head without contrast 03/26/2013  FINDINGS: Multiple ring-enhancing mass lesions are present throughout both cerebral hemispheres.  The largest lesion is in the right centrum semi ovale in measuring 13.5 x 14.0 x 19.0 mm. There are 2 lesions in the right basal ganglia which measure 17 x 13 mm and 16 x 11 mm respectively. An immediately adjacent lesion is present along the right insular cortex measuring 12 mm. High anterior frontal lobe lesions measure 17.5 x 17.0 cm on the left and 12 x 10 mm on the right. The right-sided lesion appears to involve the dura. A lesion in the left centrum semi ovale measures 9.5 mm. A high posterior right parietal lesion images 11 mm on image 76 of series 10. More inferiorly a right parietal lesion measures 11 mm on image 61. A lesion in the left thalamus measures 14 x 12 mm. A lesion in the right cerebral peduncle extending to the midbrain measures 14.5 x 12.5 x 14.0 mm. This partially extends into the third ventricle. A lesion in the right side of the cerebellum measures 12 x 8.5 mm. A smaller left cerebellar lesion measures 4 mm maximally.  Other smaller lesions bilaterally would be better characterized by with the 3 T exam a if SRS treatment is considered.  Lesions less than 5 mm are better seen on the T2 and FLAIR sequences were a second left cerebellar lesion is present. A lesion in the right cerebellar tonsil is noted. A lesion on the left side of  the cerebellar vermis is present.  Multiple additional lesions are present within the left temporal lobe and bilateral frontal lobes.  There is significant edema surrounding multiple lesions, particularly in the right basal ganglia and right centrum semi ovale. There is restricted diffusion associated with the lesions. The T1 shortening is evident within the left ally MAC lesion compatible with acute hemorrhage.  Ventricles are of normal size.  The globes and orbits are intact.  Flow is present in the major intracranial arteries. The paranasal sinuses and mastoid air cells are clear.  Remote encephalomalacia is present in the anterior frontal lobes bilaterally involving predominantly the gyrus rectus.  IMPRESSION: 1. Numerous bilateral peripherally enhancing lesions with restricted diffusion and surrounding vasogenic edema compatible with numerous metastases. The largest lesions are described above. 2. Acute/subacute blood products are noted within the left thalamic lesion.   Electronically Signed   By: CSan MorelleM.D.   On: 08/18/2014 13:29   Mr Lumbar Spine W Wo Contrast  08/17/2014   CLINICAL DATA:  Lumbar spine metastasis.  Lung cancer.  EXAM: MRI LUMBAR SPINE WITHOUT AND WITH CONTRAST  TECHNIQUE: Multiplanar and multiecho pulse sequences of the lumbar spine were obtained without and with intravenous contrast.  CONTRAST:  159mMULTIHANCE GADOBENATE DIMEGLUMINE 529 MG/ML IV SOLN  COMPARISON:  None.  FINDINGS: Known small ascites, left pleural effusion with malignant appearing pleural thickening, and right adrenal mass. There is a enhancing T2 hyperintense 5 mm mass in the intrinsic back muscles on the right at the level of T12  There is an infiltrating enhancing mass in the L1 right pedicle, articular process, body, and transverse process consistent with metastasis. There is extra osseous tumor effacing the right epidural canal and infiltrating the inferior right T12-L1 foramen. No cord or foraminal  nerve compression.  No abnormal intrathecal enhancement.  No notable degenerative change.  IMPRESSION:  1. L1 right body and posterior element metastasis with epidural and right T12-L1 foraminal infiltration. No cord or foraminal nerve compression. 2. Malignant appearing left pleural effusion with small ascites and right adrenal metastasis. There is a 5 mm soft tissue metastasis in the right intrinsic back muscles at the T12 level.   Electronically Signed   By: Monte Fantasia M.D.   On: 08/17/2014 08:53   Ct Abdomen Pelvis W Contrast  08/17/2014   CLINICAL DATA:  Recently diagnosed lung cancer.  Mid abdominal pain.  EXAM: CT ABDOMEN AND PELVIS WITH CONTRAST  TECHNIQUE: Multidetector CT imaging of the abdomen and pelvis was performed using the standard protocol following bolus administration of intravenous contrast.  CONTRAST:  69m OMNIPAQUE IOHEXOL 300 MG/ML SOLN, 1034mOMNIPAQUE IOHEXOL 300 MG/ML SOLN  COMPARISON:  08/11/2014 the large left effusion and left lower lobe collapse is again noted.  No hepatic metastases are evident. There is a low-attenuation right adrenal mass measuring 2.4 x 3.5 cm and a low-attenuation left adrenal mass measuring 1.1 x 1.7 cm. These are suspicious for hematogenous metastases. The spleen, pancreas and kidneys appear normal.  There is a small volume peritoneal ascites.  There is no bowel obstruction.  There is no extraluminal air.  There is a lytic lesion involving the right L1 pedicle with pathologic fracture. There is slight soft tissue encroachment on the right lateral aspect of the central canal at L1. There may also be soft tissue extending up into the right T12-L1 neural foramen from the L1 metastasis. There is a lytic lesion involving the left tenth rib posteriorly.  FINDINGS: *Skeletal metastasis to the right L1 pedicle with pathologic fracture and with slight soft tissue encroachment on the right lateral aspect of the central canal. There may also be soft tissue  encroachment on the right T12-L1 foramen. Lumbar MRI recommended for optimal characterization. *Additional skeletal metastasis to the left tenth rib posteriorly. *Bilateral adrenal masses, probably hematogenous metastases *Peritoneal ascites   Electronically Signed   By: DaAndreas Newport.D.   On: 08/17/2014 04:12   Mr Shoulder Right Wo Contrast  08/17/2014   CLINICAL DATA:  Right shoulder pain for 3 months. No known injury. History of lung cancer.  EXAM: MRI OF THE RIGHT SHOULDER WITHOUT CONTRAST  TECHNIQUE: Multiplanar, multisequence MR imaging of the shoulder was performed. No intravenous contrast was administered.  COMPARISON:  CT chest 08/11/2014 and 08/06/2014.  FINDINGS: Rotator cuff:  Intact and unremarkable.  Muscles: There is a mass lesion in the infraspinatus muscle belly measuring 2.3 cm craniocaudal by 3.0 cm transverse by 1.9 cm AP is consistent with a metastatic lesion. Intense surrounding soft tissue edema is identified.  Biceps long head:  Intact.  Acromioclavicular Joint: Mild to moderate degenerative change is seen with marrow edema about the joint.  Glenohumeral Joint: Unremarkable.  Labrum: The superior labrum appears degenerated but no focal tear is identified.  Bones:  The acromion is type 2.  No worrisome marrow lesion is seen.  IMPRESSION: Soft mass lesion in the infraspinatus muscle belly is most consistent with metastatic disease. Surrounding soft tissue edema is noted.  Intact rotator cuff  Mild to moderate acromioclavicular osteoarthritis.   Electronically Signed   By: ThInge Rise.D.   On: 08/17/2014 08:52   Dg Chest Port 1 View  08/22/2014   CLINICAL DATA:  Shortness of breath and chest pain, history of lung cancer  EXAM: PORTABLE CHEST - 1 VIEW  COMPARISON:  08/11/2014, 08/17/2014  FINDINGS: Enlarging left-sided pleural  effusion is again noted. Persistent left hilar mass lesion is seen. The right lung remains clear. The cardiac shadow is stable. No bony abnormality is  seen.  IMPRESSION: Increasing left-sided pleural effusion.   Electronically Signed   By: Inez Catalina M.D.   On: 08/22/2014 17:09   US Thoracentesis Asp Pleural Space W/img Guide  08/23/2014   CLINICAL DATA:  Presumed lung cancer, LEFT pleural effusion  EXAM: ULTRASOUND GUIDED DIAGNOSTIC AND THERAPEUTIC LEFT THORACENTESIS  COMPARISON:  CT chest 08/22/2014  PROCEDURE: Procedure, benefits, and risks of procedure were discussed with patient.  Written informed consent for procedure was obtained.  Time out protocol followed.  Pleural effusion localized by ultrasound at the posterior LEFT hemithorax.  Skin prepped and draped in usual sterile fashion.  Skin and soft tissues anesthetized with 8 mL of 1% lidocaine.  8 French thoracentesis catheter placed into the LEFT pleural space.  700 mL of bloody fluid aspirated by syringe pump.  Procedure tolerated well by patient without immediate complication.  COMPLICATIONS: None  FINDINGS: A total of approximately 700 ml of LEFT pleural fluid was removed. A fluid sample of 120 mL was sent for laboratory analysis.  IMPRESSION: Successful ultrasound guided LEFT thoracentesis yielding 700 mL of pleural fluid.   Electronically Signed   By: Lavonia Dana M.D.   On: 08/23/2014 12:44   US Thoracentesis Asp Pleural Space W/img Guide  08/09/2014   CLINICAL DATA:  Left pleural effusion  EXAM: ULTRASOUND GUIDED LEFT THORACENTESIS  COMPARISON:  Plain film and CT 08/06/2014  PROCEDURE: An ultrasound guided thoracentesis was thoroughly discussed with the patient and questions answered. The benefits, risks, alternatives and complications were also discussed. The patient understands and wishes to proceed with the procedure. Written consent was obtained.  Ultrasound was performed to localize and mark an adequate pocket of fluid in the left posterior chest. The area was then prepped and draped in the normal sterile fashion. 1% Lidocaine was used for local anesthesia. Under ultrasound guidance a 19  gauge Yueh catheter was introduced. Thoracentesis was performed. The catheter was removed and a dressing applied.  Complications:  None.  FINDINGS: A total of approximately 1.85 L of amber fluid was removed. A fluid sample wassent for laboratory analysis.  IMPRESSION: Successful ultrasound guided left thoracentesis yielding 1.85 L of pleural fluid.   Electronically Signed   By: Rolm Baptise M.D.   On: 08/09/2014 15:13    Microbiology: Recent Results (from the past 240 hour(s))  Blood culture (routine x 2)     Status: None (Preliminary result)   Collection Time: 08/22/14  5:51 PM  Result Value Ref Range Status   Specimen Description BLOOD RIGHT ARM  Final   Special Requests BOTTLES DRAWN AEROBIC AND ANAEROBIC [place]  Final   Culture NO GROWTH 3 DAYS  Final   Report Status PENDING  Incomplete  Blood culture (routine x 2)     Status: None (Preliminary result)   Collection Time: 08/22/14  5:53 PM  Result Value Ref Range Status   Specimen Description LEFT ANTECUBITAL  Final   Special Requests BOTTLES DRAWN AEROBIC AND ANAEROBIC [place]  Final   Culture NO GROWTH 3 DAYS  Final   Report Status PENDING  Incomplete     Labs: Basic Metabolic Panel:  Recent Labs Lab 08/22/14 1638 08/23/14 1035 08/24/14 0857 08/25/14 0640  NA 138 136 135 135  K 3.8 4.1 4.7 4.2  CL 103 102 102 104  CO2 '25 24 24 24  '$ GLUCOSE 113*  104* 109* 114*  BUN '16 14 15 17  '$ CREATININE 0.57* 0.52* 0.54* 0.44*  CALCIUM 8.2* 7.7* 7.8* 7.8*   Liver Function Tests:  Recent Labs Lab 08/23/14 1035  AST 13*  ALT 10*  ALKPHOS 67  BILITOT 0.6  PROT 7.1  ALBUMIN 3.0*   No results for input(s): LIPASE, AMYLASE in the last 168 hours. No results for input(s): AMMONIA in the last 168 hours. CBC:  Recent Labs Lab 08/22/14 1638 08/23/14 1035 08/24/14 0857  WBC 17.6* 18.4* 17.5*  NEUTROABS 15.2*  --   --   HGB 15.8 14.0 14.4  HCT 45.5 41.3 41.9  MCV 92.9 92.8 93.3  PLT 427* 389 401*   Cardiac Enzymes:  Recent  Labs Lab 08/22/14 1638  TROPONINI <0.03   BNP: BNP (last 3 results)  Recent Labs  08/06/14 1609  BNP 16.0    ProBNP (last 3 results) No results for input(s): PROBNP in the last 8760 hours.  CBG: No results for input(s): GLUCAP in the last 168 hours.     SignedKelvin Cellar  [facility] 08/25/2014, 10:22 AM

## 2014-08-27 LAB — CULTURE, BLOOD (ROUTINE X 2)
CULTURE: NO GROWTH
Culture: NO GROWTH

## 2014-09-06 ENCOUNTER — Ambulatory Visit (HOSPITAL_COMMUNITY): Payer: Self-pay | Admitting: Oncology

## 2014-09-15 DEATH — deceased

## 2016-05-16 IMAGING — CT CT CHEST W/ CM
2 of 6 series · 13 of 36 positions shown, 16 images · IV contrast (Omnipaque 300)
Comparison: Chest x-ray today

CLINICAL DATA: Hypotension and tachycardia. Left pleural effusion.
Smoker

EXAM:
CT CHEST WITH CONTRAST
TECHNIQUE: Multidetector CT imaging of the chest was performed during
intravenous contrast administration.
CONTRAST:  80mL OMNIPAQUE IOHEXOL 300 MG/ML  SOLN

[Series 2: chestroutine 5.0 b40f · axial · 0.72mm/px · z∈[-376,-101]mm · 12 of 66 slices shown, 15 images]
[im 6/66  mediastinal]
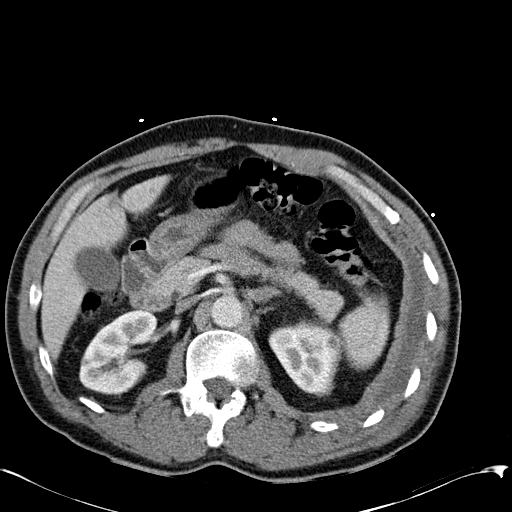
[im 6/66  lung]
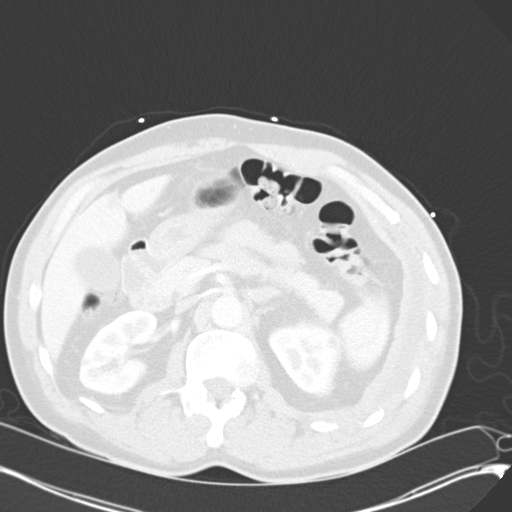
[im 11/66  lung]
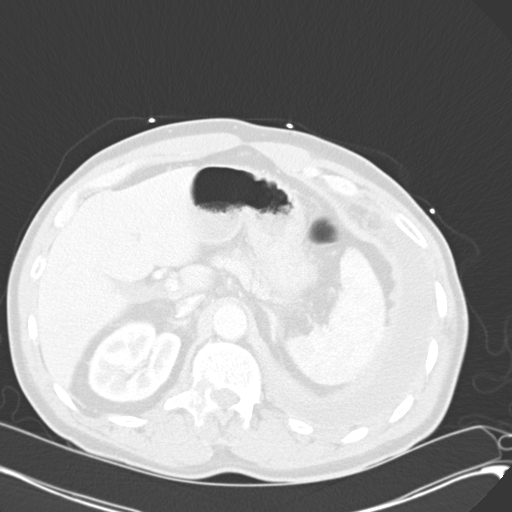
[im 16/66  lung]
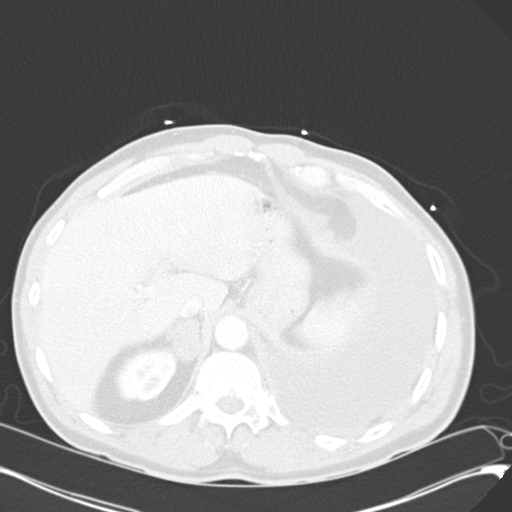
[im 21/66  lung]
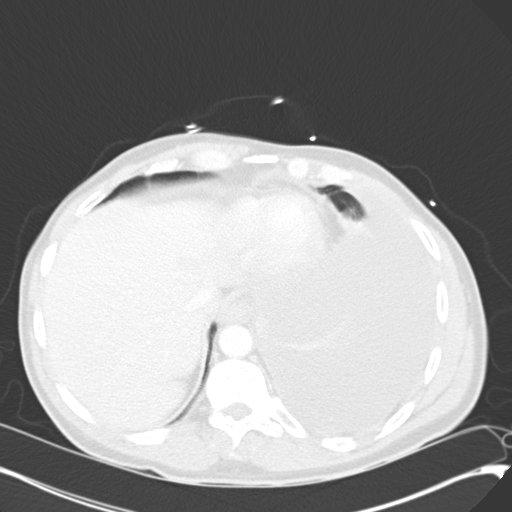
[im 26/66  mediastinal]
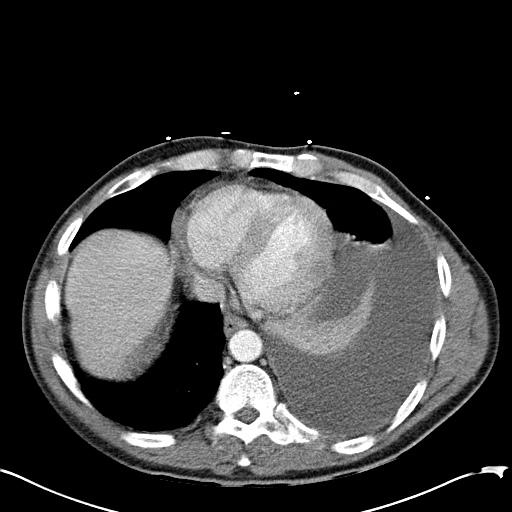
[im 26/66  lung]
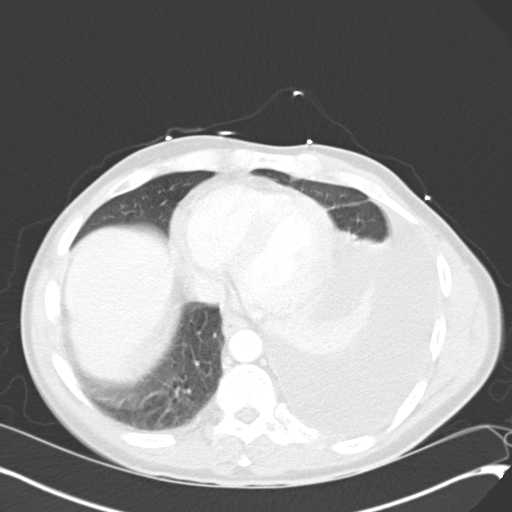
[im 31/66  lung]
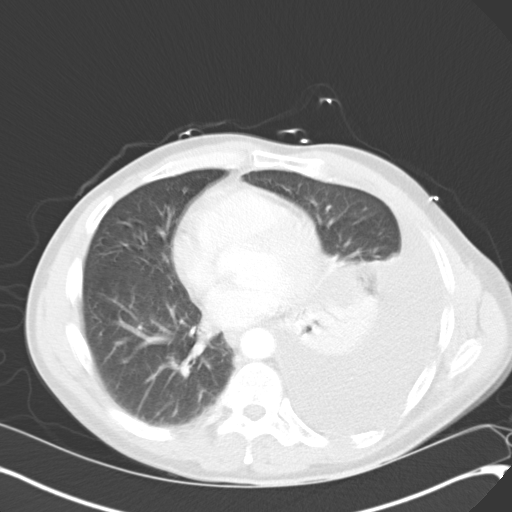
[im 36/66  lung]
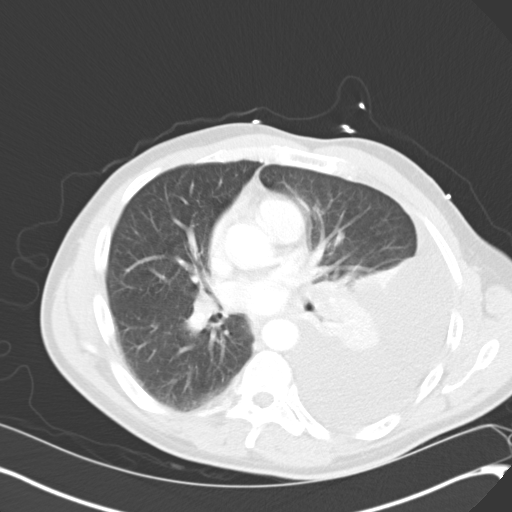
[im 41/66  lung]
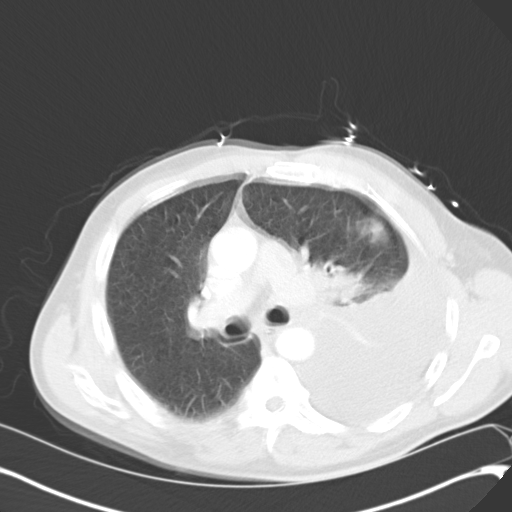
[im 46/66  mediastinal]
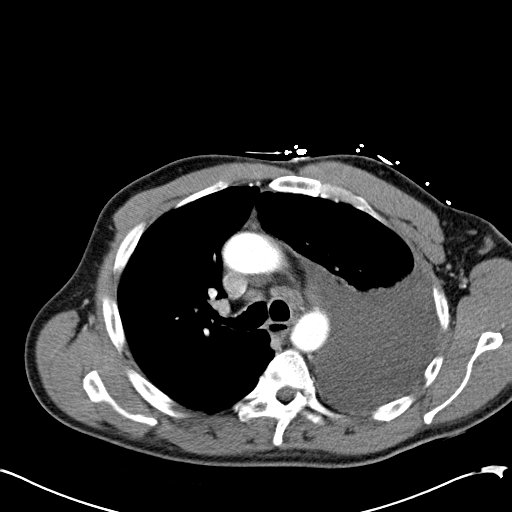
[im 46/66  lung]
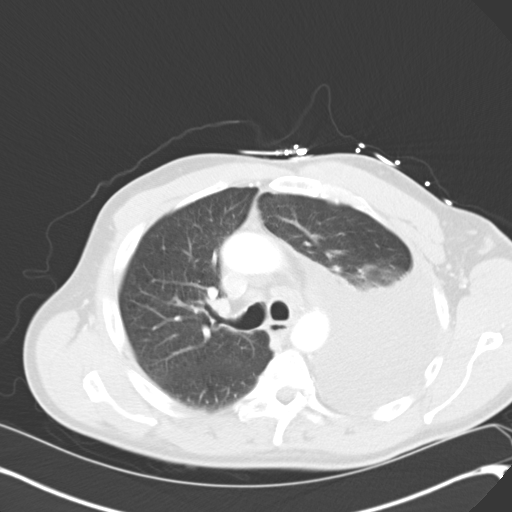
[im 51/66  lung]
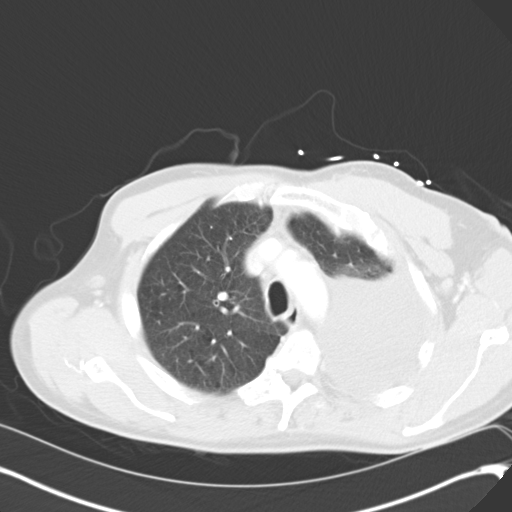
[im 56/66  lung]
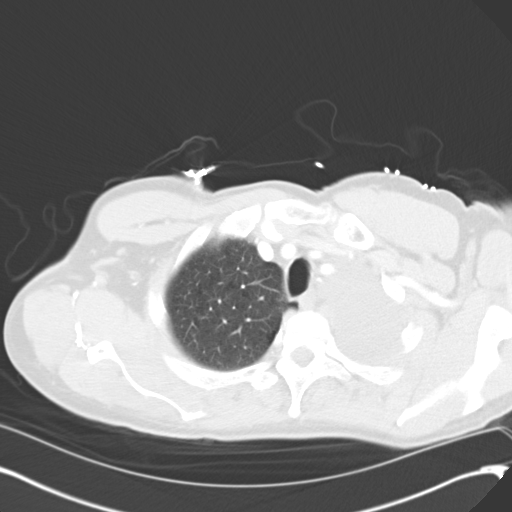
[im 61/66  lung]
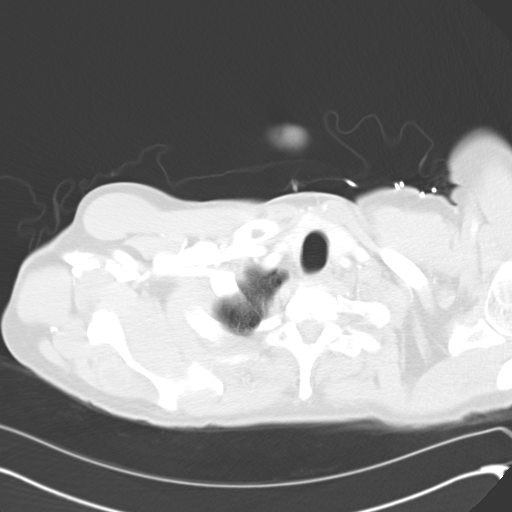

[Series 4: mpr coronal chest 3mm · coronal · 0.65mm/px · 1 of 97 slices shown]
[im 49/97  lung]
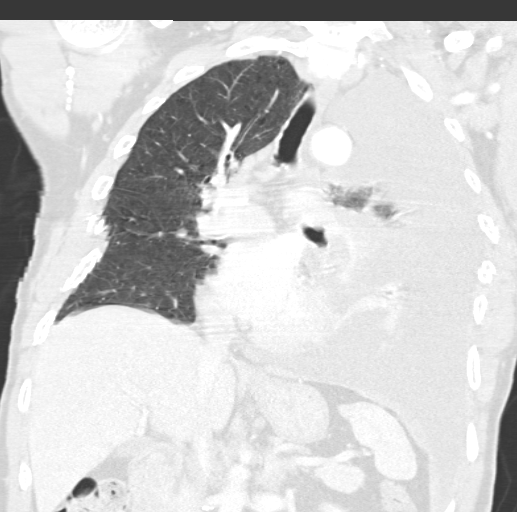

[13 of 36 positions shown; findings below may reference images not displayed]

FINDINGS: Image quality degraded by motion.

Large left pleural effusion with compressive atelectasis of the left
lower lobe. Left upper lobe is partially expanded. There is a
probable mass lesion in the left hilum measuring 28 mm with probable
adjacent left hilar adenopathy. Findings are suspicious for
carcinoma of the lung. 12 mm right paratracheal lymph node. 12 mm
lymph node anterior to the left bronchus. No subcarinal enlarged
lymph nodes.

Right lung is clear.  No lung nodule is identified.

Bony destructive lesions compatible with metastatic disease. There
is a lesion on the right involving T3. Lesion in the left L1
vertebral body and pedicle extending into the lamina. Left-sided rib
lesions.

Right adrenal enlargement measuring 26 mm. This has low density and
most likely is adrenal adenoma rather than metastatic disease.

12 mm right thyroid lesion is indeterminate.
IMPRESSION: Large left pleural effusion with compressive atelectasis left lower
lobe. There is a probable mass the left hilum compatible with
carcinoma of the lung. PET CT suggested for staging. Bony metastatic
disease is noted.

Right adrenal lesion most likely a benign adenoma rather than
metastatic disease.

12 mm right thyroid lesion.  Thyroid ultrasound recommended.

## 2016-05-19 IMAGING — US US THORACENTESIS ASP PLEURAL SPACE W/IMG GUIDE
1 series · 2 of 2 positions shown · non-contrast
Comparison: Plain film and CT 08/06/2014

CLINICAL DATA: Left pleural effusion

EXAM:
ULTRASOUND GUIDED LEFT THORACENTESIS

[Series 1: us thoracentesis asp pleural space w/img guide · 0.24mm/px · 2 of 2 slices shown]
[im 1/2]
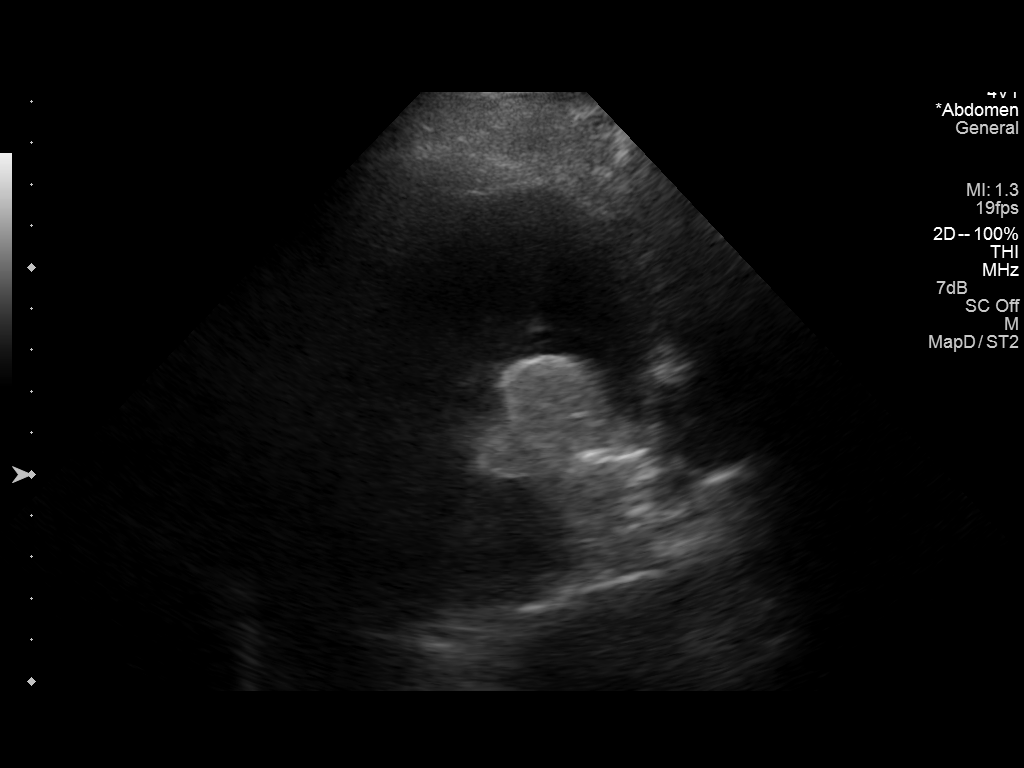
[im 2/2]
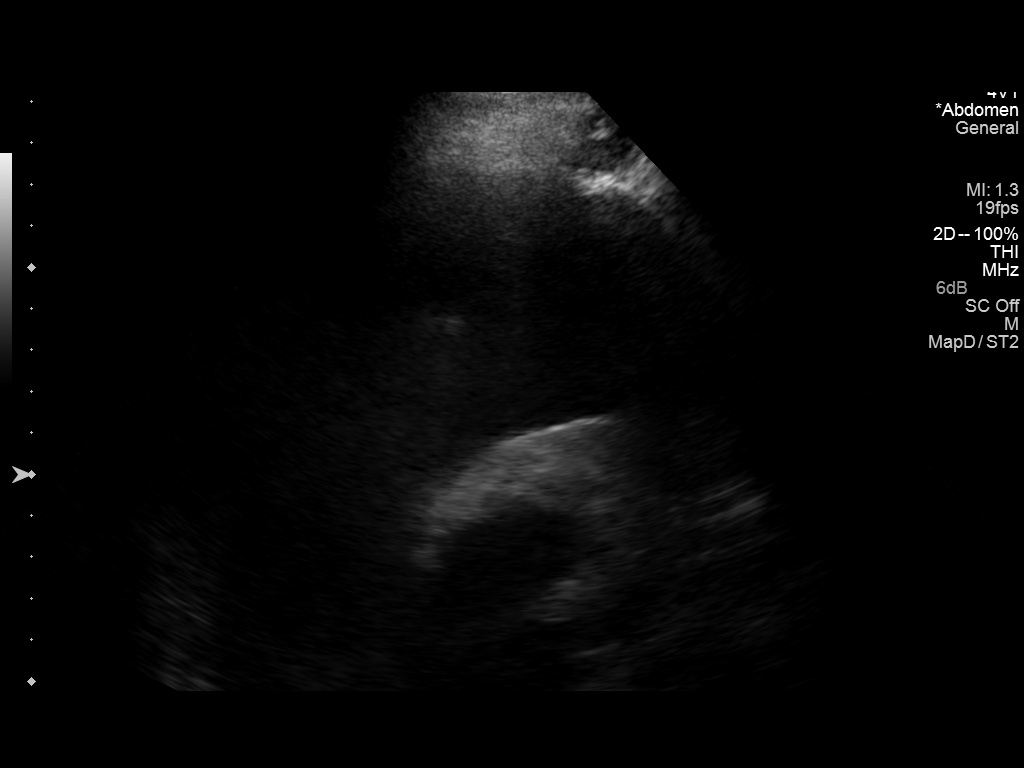

[2 of 2 positions shown; findings below may reference images not displayed]

PROCEDURE:
An ultrasound guided thoracentesis was thoroughly discussed with the
patient and questions answered. The benefits, risks, alternatives
and complications were also discussed. The patient understands and
wishes to proceed with the procedure. Written consent was obtained.

Ultrasound was performed to localize and mark an adequate pocket of
fluid in the left posterior chest. The area was then prepped and
draped in the normal sterile fashion. 1% Lidocaine was used for
local anesthesia. Under ultrasound guidance a 19 gauge Yueh catheter
was introduced. Thoracentesis was performed. The catheter was
removed and a dressing applied.

Complications:  None.
FINDINGS: A total of approximately 1.85 L of amber fluid was removed. A fluid
sample wassent for laboratory analysis.
IMPRESSION: Successful ultrasound guided left thoracentesis yielding 1.85 L of
pleural fluid.
